# Patient Record
Sex: Female | Born: 1955 | Hispanic: Yes | Smoking: Never smoker
Health system: Southern US, Community
[De-identification: ages and names within clinical notes are randomized; demographics above are authoritative.]

## PROBLEM LIST (undated history)

## (undated) DIAGNOSIS — Z86010 Personal history of colonic polyps: Secondary | ICD-10-CM

## (undated) DIAGNOSIS — Z860101 Personal history of adenomatous and serrated colon polyps: Secondary | ICD-10-CM

## (undated) DIAGNOSIS — E042 Nontoxic multinodular goiter: Secondary | ICD-10-CM

## (undated) DIAGNOSIS — I499 Cardiac arrhythmia, unspecified: Secondary | ICD-10-CM

## (undated) DIAGNOSIS — I10 Essential (primary) hypertension: Secondary | ICD-10-CM

## (undated) HISTORY — PX: TUBAL LIGATION: SHX77

## (undated) HISTORY — PX: ABDOMINAL HYSTERECTOMY: SHX81

---

## 2005-08-06 ENCOUNTER — Emergency Department: Payer: Self-pay | Admitting: Internal Medicine

## 2005-08-06 ENCOUNTER — Other Ambulatory Visit: Payer: Self-pay

## 2006-05-08 ENCOUNTER — Emergency Department: Payer: Self-pay | Admitting: Emergency Medicine

## 2006-09-27 ENCOUNTER — Ambulatory Visit: Payer: Self-pay

## 2007-11-25 DIAGNOSIS — F418 Other specified anxiety disorders: Secondary | ICD-10-CM | POA: Insufficient documentation

## 2008-01-02 DIAGNOSIS — J309 Allergic rhinitis, unspecified: Secondary | ICD-10-CM | POA: Insufficient documentation

## 2008-04-07 ENCOUNTER — Encounter: Payer: Self-pay | Admitting: Family Medicine

## 2008-06-10 ENCOUNTER — Ambulatory Visit: Payer: Self-pay | Admitting: Pain Medicine

## 2008-07-22 ENCOUNTER — Ambulatory Visit: Payer: Self-pay | Admitting: Pain Medicine

## 2008-07-23 ENCOUNTER — Emergency Department: Payer: Self-pay | Admitting: Emergency Medicine

## 2008-08-05 ENCOUNTER — Ambulatory Visit: Payer: Self-pay | Admitting: Pain Medicine

## 2009-12-10 DIAGNOSIS — N951 Menopausal and female climacteric states: Secondary | ICD-10-CM | POA: Insufficient documentation

## 2010-04-22 ENCOUNTER — Emergency Department: Payer: Self-pay | Admitting: Emergency Medicine

## 2010-11-14 DIAGNOSIS — D126 Benign neoplasm of colon, unspecified: Secondary | ICD-10-CM | POA: Insufficient documentation

## 2012-01-24 DIAGNOSIS — Z0000 Encounter for general adult medical examination without abnormal findings: Secondary | ICD-10-CM | POA: Insufficient documentation

## 2012-09-11 ENCOUNTER — Emergency Department: Payer: Self-pay | Admitting: Emergency Medicine

## 2013-02-26 DIAGNOSIS — E049 Nontoxic goiter, unspecified: Secondary | ICD-10-CM | POA: Insufficient documentation

## 2013-09-24 DIAGNOSIS — M542 Cervicalgia: Secondary | ICD-10-CM | POA: Insufficient documentation

## 2013-10-08 DIAGNOSIS — M26629 Arthralgia of temporomandibular joint, unspecified side: Secondary | ICD-10-CM | POA: Insufficient documentation

## 2015-10-29 ENCOUNTER — Encounter: Payer: Self-pay | Admitting: Emergency Medicine

## 2015-10-29 ENCOUNTER — Emergency Department
Admission: EM | Admit: 2015-10-29 | Discharge: 2015-10-29 | Disposition: A | Discharge status: Home / Self Care | Payer: BLUE CROSS/BLUE SHIELD | Attending: Emergency Medicine | Admitting: Emergency Medicine

## 2015-10-29 ENCOUNTER — Emergency Department: Payer: BLUE CROSS/BLUE SHIELD

## 2015-10-29 DIAGNOSIS — R000 Tachycardia, unspecified: Secondary | ICD-10-CM | POA: Insufficient documentation

## 2015-10-29 DIAGNOSIS — R61 Generalized hyperhidrosis: Secondary | ICD-10-CM | POA: Diagnosis not present

## 2015-10-29 DIAGNOSIS — Z79899 Other long term (current) drug therapy: Secondary | ICD-10-CM | POA: Insufficient documentation

## 2015-10-29 DIAGNOSIS — I10 Essential (primary) hypertension: Secondary | ICD-10-CM | POA: Diagnosis not present

## 2015-10-29 DIAGNOSIS — J181 Lobar pneumonia, unspecified organism: Secondary | ICD-10-CM

## 2015-10-29 DIAGNOSIS — R5383 Other fatigue: Secondary | ICD-10-CM | POA: Diagnosis not present

## 2015-10-29 DIAGNOSIS — J189 Pneumonia, unspecified organism: Secondary | ICD-10-CM

## 2015-10-29 DIAGNOSIS — R079 Chest pain, unspecified: Secondary | ICD-10-CM | POA: Diagnosis present

## 2015-10-29 HISTORY — DX: Essential (primary) hypertension: I10

## 2015-10-29 LAB — COMPREHENSIVE METABOLIC PANEL
ALK PHOS: 84 U/L (ref 38–126)
ALT: 29 U/L (ref 14–54)
AST: 32 U/L (ref 15–41)
Albumin: 4.3 g/dL (ref 3.5–5.0)
Anion gap: 7 (ref 5–15)
BUN: 13 mg/dL (ref 6–20)
CO2: 23 mmol/L (ref 22–32)
CREATININE: 0.69 mg/dL (ref 0.44–1.00)
Calcium: 8.3 mg/dL — ABNORMAL LOW (ref 8.9–10.3)
Chloride: 103 mmol/L (ref 101–111)
GFR calc Af Amer: >60 mL/min (ref >60)
Glucose, Bld: 156 mg/dL — ABNORMAL HIGH (ref 65–99)
Potassium: 2.9 mmol/L — CL (ref 3.5–5.1)
Sodium: 133 mmol/L — ABNORMAL LOW (ref 135–145)
TOTAL PROTEIN: 7.7 g/dL (ref 6.5–8.1)
Total Bilirubin: 1 mg/dL (ref 0.3–1.2)

## 2015-10-29 LAB — URINALYSIS COMPLETE WITH MICROSCOPIC (ARMC ONLY)
BACTERIA UA: NONE SEEN
BILIRUBIN URINE: NEGATIVE
GLUCOSE, UA: NEGATIVE mg/dL
Ketones, ur: NEGATIVE mg/dL
Leukocytes, UA: NEGATIVE
NITRITE: NEGATIVE
Protein, ur: NEGATIVE mg/dL
SQUAMOUS EPITHELIAL / LPF: NONE SEEN
Specific Gravity, Urine: 1.001 — ABNORMAL LOW (ref 1.005–1.030)
WBC UA: NONE SEEN WBC/hpf (ref 0–5)
pH: 7 (ref 5.0–8.0)

## 2015-10-29 LAB — CBC WITH DIFFERENTIAL/PLATELET
Basophils Absolute: 0.1 10*3/uL (ref 0–0.1)
Basophils Relative: 1 %
EOS PCT: 1 %
Eosinophils Absolute: 0.1 10*3/uL (ref 0–0.7)
HEMATOCRIT: 38.5 % (ref 35.0–47.0)
Hemoglobin: 12.9 g/dL (ref 12.0–16.0)
LYMPHS ABS: 0.6 10*3/uL — AB (ref 1.0–3.6)
LYMPHS PCT: 6 %
MCH: 27.1 pg (ref 26.0–34.0)
MCHC: 33.6 g/dL (ref 32.0–36.0)
MCV: 80.8 fL (ref 80.0–100.0)
MONO ABS: 0.6 10*3/uL (ref 0.2–0.9)
MONOS PCT: 6 %
NEUTROS ABS: 8.6 10*3/uL — AB (ref 1.4–6.5)
Neutrophils Relative %: 86 %
PLATELETS: 256 10*3/uL (ref 150–440)
RBC: 4.77 MIL/uL (ref 3.80–5.20)
RDW: 14.6 % — AB (ref 11.5–14.5)
WBC: 9.9 10*3/uL (ref 3.6–11.0)

## 2015-10-29 LAB — RAPID INFLUENZA A&B ANTIGENS (ARMC ONLY): INFLUENZA B (ARMC): DETECTED

## 2015-10-29 LAB — PROTIME-INR
INR: 0.99
PROTHROMBIN TIME: 13.3 s (ref 11.4–15.0)

## 2015-10-29 LAB — RAPID INFLUENZA A&B ANTIGENS: Influenza A (ARMC): NOT DETECTED

## 2015-10-29 LAB — TROPONIN I

## 2015-10-29 LAB — LACTIC ACID, PLASMA: Lactic Acid, Venous: 1.1 mmol/L (ref 0.5–2.0)

## 2015-10-29 MED ORDER — IOHEXOL 350 MG/ML SOLN: 100.0000 mL | Freq: Once | INTRAVENOUS | Status: DC | PRN

## 2015-10-29 MED ORDER — HYDROCODONE-ACETAMINOPHEN 5-325 MG PO TABS
1.0000 | ORAL_TABLET | Freq: Once | ORAL | Status: AC
  Administered 2015-10-29: 1 via ORAL
  Filled 2015-10-29: qty 1

## 2015-10-29 MED ORDER — LEVOFLOXACIN 750 MG PO TABS
750.0000 mg | ORAL_TABLET | Freq: Once | ORAL | Status: AC
  Administered 2015-10-29: 750 mg via ORAL
  Filled 2015-10-29: qty 1

## 2015-10-29 MED ORDER — LEVOFLOXACIN 750 MG PO TABS: 750.0000 mg | ORAL_TABLET | Freq: Every day | ORAL | Status: DC

## 2015-10-29 MED ORDER — SODIUM CHLORIDE 0.9 % IV BOLUS (SEPSIS)
1000.0000 mL | INTRAVENOUS | Status: AC
Start: 2015-10-29 — End: 2015-10-29
  Administered 2015-10-29: 1000 mL via INTRAVENOUS

## 2015-10-29 MED ORDER — IBUPROFEN 600 MG PO TABS
600.0000 mg | ORAL_TABLET | ORAL | Status: AC
  Administered 2015-10-29: 600 mg via ORAL
  Filled 2015-10-29: qty 1

## 2015-10-29 MED ORDER — HYDROCODONE-ACETAMINOPHEN 5-325 MG PO TABS
1.0000 | ORAL_TABLET | ORAL | Status: AC
  Administered 2015-10-29: 1 via ORAL
  Filled 2015-10-29: qty 1

## 2015-10-29 MED ORDER — HYDROCODONE-ACETAMINOPHEN 5-325 MG PO TABS: 1.0000 | ORAL_TABLET | Freq: Four times a day (QID) | ORAL | Status: DC | PRN

## 2015-10-29 MED ORDER — POTASSIUM CHLORIDE CRYS ER 20 MEQ PO TBCR
40.0000 meq | EXTENDED_RELEASE_TABLET | Freq: Once | ORAL | Status: AC
  Administered 2015-10-29: 40 meq via ORAL
  Filled 2015-10-29: qty 2

## 2015-10-29 NOTE — ED Provider Notes (Signed)
[facility] Emergency Department Provider Note  ____________________________________________  Time seen: Approximately 2:35 PM  I have reviewed the triage vital signs and the nursing notes.   HISTORY  Chief Complaint Chest Pain; Shortness of Breath; and Cough    HPI [facility] Jamie Trevino is a 60 y.o. female presents for evaluation of pain behind the left breast with associated cough. She began having fevers chills and general aches. She feels like "her bones" are aching. She has had nausea and vomited once earlier today.  States she has a sharp and achy pain behind the left breast area. Denies headache or neck pain or stiffness. No abdominal pain. No rash. No recent illness. No history of immune problems. She does report not having had a flu shot this year.   Past Medical History  Diagnosis Date  . Hypertension     There are no active problems to display for this patient.   Past Surgical History  Procedure Laterality Date  . Abdominal hysterectomy      Current Outpatient Rx  Name  Route  Sig  Dispense  Refill  . metoprolol tartrate (LOPRESSOR) 25 MG tablet   Oral   Take 25 mg by mouth 2 (two) times daily.         Marland Kitchen HYDROcodone-acetaminophen (NORCO/VICODIN) 5-325 MG tablet   Oral   Take 1 tablet by mouth every 6 (six) hours as needed for moderate pain.   15 tablet   0   . levofloxacin (LEVAQUIN) 750 MG tablet   Oral   Take 1 tablet (750 mg total) by mouth daily.   5 tablet   0     Allergies Review of patient's allergies indicates no known allergies.  History reviewed. No pertinent family history.  Social History Social History  Substance Use Topics  . Smoking status: Never Smoker   . Smokeless tobacco: None  . Alcohol Use: No    Review of Systems Constitutional: Fatigue Eyes: No visual changes. ENT: No sore throat. Cardiovascular: See history of present illness Respiratory: Mild shortness of breath Gastrointestinal: No  abdominal pain.  No diarrhea.  No constipation. Genitourinary: Negative for dysuria. Musculoskeletal: Negative for back pain. Skin: Negative for rash. Neurological: Negative for headaches, focal weakness or numbness.  10-point ROS otherwise negative.  ____________________________________________   PHYSICAL EXAM:  VITAL SIGNS: ED Triage Vitals  Enc Vitals Group     BP 10/29/15 1307 148/98 mmHg     Pulse Rate 10/29/15 1307 123     Resp 10/29/15 1307 23     Temp 10/29/15 1307 100.5 F (38.1 C)     Temp Source 10/29/15 1307 Oral     SpO2 10/29/15 1307 97 %     Weight 10/29/15 1307 127 lb (57.607 kg)     Height 10/29/15 1307 4\' 11"  (1.499 m)     Head Cir --      Peak Flow --      Pain Score 10/29/15 1349 10     Pain Loc --      Pain Edu? --      Excl. in [place]? --   History physical exam performed using Spanish interpreter Constitutional: Alert and oriented. Slightly fatigued, slightly diaphoretic but in no distress. Very pleasant Eyes: Conjunctivae are normal. PERRL. EOMI. Head: Atraumatic. Nose: No congestion/rhinnorhea. Mouth/Throat: Mucous membranes are moist.  Oropharynx non-erythematous. Neck: No stridor.  No meningismus Cardiovascular: Tachycardic rate, regular rhythm. Grossly normal heart sounds.  Good peripheral circulation. Respiratory: Tachypnea, but otherwise Normal.  No retractions. Lungs CTAB. Gastrointestinal: Soft and nontender. No distention. No abdominal bruits. No CVA tenderness. Musculoskeletal: No lower extremity tenderness nor edema.  No joint effusions. Neurologic:  Normal speech and language. No gross focal neurologic deficits are appreciated. No gait instability. Skin:  Skin is warm, diaphoretic and intact. No rash noted. Psychiatric: Mood and affect are normal. Speech and behavior are normal.  ____________________________________________   LABS (all labs ordered are listed, but only abnormal results are displayed)  Labs Reviewed  COMPREHENSIVE  METABOLIC PANEL - Abnormal; Notable for the following:    Sodium 133 (*)    Potassium 2.9 (*)    Glucose, Bld 156 (*)    Calcium 8.3 (*)    All other components within normal limits  CBC WITH DIFFERENTIAL/PLATELET - Abnormal; Notable for the following:    RDW 14.6 (*)    Neutro Abs 8.6 (*)    Lymphs Abs 0.6 (*)    All other components within normal limits  URINALYSIS COMPLETEWITH MICROSCOPIC ([facility] ONLY) - Abnormal; Notable for the following:    Color, Urine COLORLESS (*)    APPearance CLEAR (*)    Specific Gravity, Urine 1.001 (*)    Hgb urine dipstick 1+ (*)    All other components within normal limits  RAPID INFLUENZA A&B ANTIGENS ([facility] ONLY)  CULTURE, BLOOD (ROUTINE X 2)  CULTURE, BLOOD (ROUTINE X 2)  URINE CULTURE  LACTIC ACID, PLASMA  TROPONIN I  PROTIME-INR  INFLUENZA PANEL BY PCR (TYPE A & B, H1N1)   ____________________________________________  EKG  Reviewed her by me at 1310 Heart rate 120 QRS 96 QTc 480 Reviewed and interpreted as sinus tachycardia, no acute ischemic abnormality ____________________________________________  RADIOLOGY  DG Chest 2 View (Final result) Result time: 10/29/15 13:54:29   Final result by Rad Results In Interface (10/29/15 13:54:29)   Narrative:   CLINICAL DATA: Chest tightness. Right-sided rib pain.  EXAM: CHEST 2 VIEW  COMPARISON: 04/22/2010; 07/23/2008  FINDINGS: Grossly unchanged cardiac silhouette and mediastinal contours. Evaluation the retrosternal clear space obscured secondary overlying soft tissues. There is mild diffuse slightly nodular thickening of the pulmonary interstitium. Minimal left basilar linear heterogeneous opacities favored to represent atelectasis. No discrete focal airspace opacities. No pleural effusion or pneumothorax. No evidence of edema. No acute osseus abnormalities.  IMPRESSION: Findings suggestive of airways disease / bronchitis. No focal airspace opacities to suggest  pneumonia.   Electronically Signed By: Sandi Mariscal M.D. On: 10/29/2015 13:54      CT Angio Chest PE W/Cm &/Or Wo Cm (Final result) Result time: 10/29/15 18:08:01   Final result by Rad Results In Interface (10/29/15 18:08:01)   Narrative:   CLINICAL DATA: Chest tightness, nausea, vomiting, cough.  EXAM: CT ANGIOGRAPHY CHEST WITH CONTRAST  TECHNIQUE: Multidetector CT imaging of the chest was performed using the standard protocol during bolus administration of intravenous contrast. Multiplanar CT image reconstructions and MIPs were obtained to evaluate the vascular anatomy.  CONTRAST: 100 cc Omnipaque 350 IV  COMPARISON: None.  FINDINGS: No filling defects in the pulmonary arteries to suggest pulmonary emboli. Heart is upper limits normal in size. Aorta is normal caliber. No mediastinal, hilar, or axillary adenopathy. Chest wall soft tissues are unremarkable.  Ground-glass densities noted within the left upper lobe peripherally. This could reflect early area of consolidation or small airways disease/ bronchiolitis. No suspicious pulmonary nodules. No pleural effusions.  Imaging into the upper abdomen shows no acute findings. No acute bony abnormality or focal bone lesion.  Review of the MIP  images confirms the above findings.  IMPRESSION: Patchy ground-glass opacities noted in the left upper lobe posteriorly and laterally. Cannot exclude early infiltrate/pneumonia or small airways disease/ bronchiolitis.  No evidence of pulmonary embolus.   Electronically Signed By: Rolm Baptise M.D. On: 10/29/2015 18:08    ____________________________________________   PROCEDURES  Procedure(s) performed: None  Critical Care performed: No  ____________________________________________   INITIAL IMPRESSION / ASSESSMENT AND PLAN / ED COURSE  Pertinent labs & imaging results that were available during my care of the patient were reviewed by me and  considered in my medical decision making (see chart for details).  Patient presents for cough, left lower back pain noted to have low-grade fever. Chest x-ray clear, and no evidence of wheezing or reactive airway disease to auscultation.  EKG and troponin reassuring, patient had CT angiography to evaluate for pulmonary embolism in the setting of left sided low mid back pain. CT concerning for possible pneumonia. After receiving fluids, patient's blood pressure is stable, heart rate is normalized, and her oxygen saturation is normal on room air. She reports improvement is currently resting comfortably. As she appears to be most appropriate for outpatient management. I will initiate Levaquin by mouth, provide pain control.   ----------------------------------------- 8:06 PM on 10/29/2015 -----------------------------------------  Patient reports feeling much improved. She is currently awake and alert with normal hemodynamics. Plan to discharge her with Levaquin, prescription for hydrocodone for pain. She reports feeling much better. Lungs are clear, speaking in full clear sentences with no signs of respiratory distress. Respiratory rate currently 18, oxygen saturation 99%.  Patient appropriate for outpatient treatment. Careful return precautions discussed with patient and family.  Return precautions and treatment recommendations and follow-up discussed with the patient who is agreeable with the plan.  I will prescribe the patient a narcotic pain medicine due to their condition which I anticipate will cause at least moderate pain short term. I discussed with the patient safe use of narcotic pain medicines, and that they are not to drive, work in dangerous areas, or ever take more than prescribed (no more than 1 pill every 6 hours). We discussed that this is the type of medication that can be  overdosed on and the risks of this type of medicine. Patient is very agreeable to only use as prescribed and to  never use more than prescribed.  ____________________________________________   FINAL CLINICAL IMPRESSION(S) / ED DIAGNOSES  Final diagnoses:  Chest pain  Left upper lobe pneumonia      Delman Kitten, MD 10/29/15 2009

## 2015-10-29 NOTE — ED Notes (Signed)
Urine is clear

## 2015-10-29 NOTE — ED Notes (Signed)
Report given to Amy RN.

## 2015-10-29 NOTE — ED Notes (Signed)
Pt presents with left breast pain and cough for three days. Dry cough  Noted, back pain when coughing.

## 2015-10-29 NOTE — ED Notes (Signed)
Patient from home with c/o backpain (right sided). C/o chest tightness with nausea and vomiting with cough. Patient appears in distress. Pain onset at 0900

## 2015-10-29 NOTE — ED Notes (Signed)
Patient transported to CT 

## 2015-10-29 NOTE — ED Notes (Signed)
Resumed care from [facility].  Pt states she aches all over.  nsr on monitor.  Iv in place.  Family with pt.

## 2015-10-31 LAB — URINE CULTURE

## 2015-11-03 LAB — CULTURE, BLOOD (ROUTINE X 2)
CULTURE: NO GROWTH
Culture: NO GROWTH

## 2015-12-09 DIAGNOSIS — R7309 Other abnormal glucose: Secondary | ICD-10-CM | POA: Insufficient documentation

## 2015-12-24 DIAGNOSIS — I10 Essential (primary) hypertension: Secondary | ICD-10-CM | POA: Insufficient documentation

## 2016-01-10 DIAGNOSIS — R079 Chest pain, unspecified: Secondary | ICD-10-CM | POA: Insufficient documentation

## 2016-06-19 ENCOUNTER — Other Ambulatory Visit: Payer: Self-pay | Admitting: Physician Assistant

## 2016-06-19 DIAGNOSIS — Z1231 Encounter for screening mammogram for malignant neoplasm of breast: Secondary | ICD-10-CM

## 2016-07-26 ENCOUNTER — Ambulatory Visit: Payer: BLUE CROSS/BLUE SHIELD

## 2016-08-07 ENCOUNTER — Ambulatory Visit
Admission: RE | Admit: 2016-08-07 | Discharge: 2016-08-07 | Disposition: A | Discharge status: Home / Self Care | Payer: BLUE CROSS/BLUE SHIELD | Source: Ambulatory Visit | Attending: Physician Assistant | Admitting: Physician Assistant

## 2016-08-07 DIAGNOSIS — Z1231 Encounter for screening mammogram for malignant neoplasm of breast: Secondary | ICD-10-CM | POA: Insufficient documentation

## 2016-09-14 DIAGNOSIS — Z86010 Personal history of colonic polyps: Secondary | ICD-10-CM | POA: Insufficient documentation

## 2016-09-14 HISTORY — PX: OTHER SURGICAL HISTORY: SHX169

## 2016-09-15 DIAGNOSIS — K219 Gastro-esophageal reflux disease without esophagitis: Secondary | ICD-10-CM | POA: Insufficient documentation

## 2016-10-24 DIAGNOSIS — M67442 Ganglion, left hand: Secondary | ICD-10-CM | POA: Insufficient documentation

## 2016-11-17 DIAGNOSIS — E042 Nontoxic multinodular goiter: Secondary | ICD-10-CM | POA: Insufficient documentation

## 2016-12-21 ENCOUNTER — Encounter: Payer: Self-pay | Admitting: *Deleted

## 2016-12-22 ENCOUNTER — Ambulatory Visit: Payer: BLUE CROSS/BLUE SHIELD | Admitting: Certified Registered Nurse Anesthetist

## 2016-12-22 ENCOUNTER — Ambulatory Visit
Admission: RE | Admit: 2016-12-22 | Discharge: 2016-12-22 | Disposition: A | Discharge status: Home / Self Care | Payer: BLUE CROSS/BLUE SHIELD | Source: Ambulatory Visit | Attending: Unknown Physician Specialty | Admitting: Unknown Physician Specialty

## 2016-12-22 ENCOUNTER — Encounter
Admission: RE | Disposition: A | Discharge status: Home / Self Care | Payer: Self-pay | Source: Ambulatory Visit | Attending: Unknown Physician Specialty

## 2016-12-22 ENCOUNTER — Encounter: Payer: Self-pay | Admitting: *Deleted

## 2016-12-22 DIAGNOSIS — I10 Essential (primary) hypertension: Secondary | ICD-10-CM | POA: Insufficient documentation

## 2016-12-22 DIAGNOSIS — Z86010 Personal history of colonic polyps: Secondary | ICD-10-CM | POA: Diagnosis not present

## 2016-12-22 DIAGNOSIS — Z79899 Other long term (current) drug therapy: Secondary | ICD-10-CM | POA: Diagnosis not present

## 2016-12-22 DIAGNOSIS — K5730 Diverticulosis of large intestine without perforation or abscess without bleeding: Secondary | ICD-10-CM | POA: Diagnosis not present

## 2016-12-22 DIAGNOSIS — Z1211 Encounter for screening for malignant neoplasm of colon: Secondary | ICD-10-CM | POA: Diagnosis not present

## 2016-12-22 DIAGNOSIS — K640 First degree hemorrhoids: Secondary | ICD-10-CM | POA: Insufficient documentation

## 2016-12-22 HISTORY — PX: COLONOSCOPY: SHX5424

## 2016-12-22 SURGERY — COLONOSCOPY
Anesthesia: General

## 2016-12-22 MED ORDER — LIDOCAINE HCL (CARDIAC) 20 MG/ML IV SOLN
INTRAVENOUS | Status: DC | PRN
  Administered 2016-12-22: 50 mg via INTRAVENOUS

## 2016-12-22 MED ORDER — PROPOFOL 500 MG/50ML IV EMUL
INTRAVENOUS | Status: DC | PRN
  Administered 2016-12-22: 140 ug/kg/min via INTRAVENOUS

## 2016-12-22 MED ORDER — PROPOFOL 10 MG/ML IV BOLUS
INTRAVENOUS | Status: DC | PRN
  Administered 2016-12-22: 30 mg via INTRAVENOUS

## 2016-12-22 MED ORDER — ONDANSETRON HCL 4 MG/2ML IJ SOLN: 4.0000 mg | Freq: Once | INTRAMUSCULAR | Status: DC | PRN

## 2016-12-22 MED ORDER — LIDOCAINE HCL (PF) 2 % IJ SOLN
INTRAMUSCULAR | Status: AC
  Filled 2016-12-22: qty 2

## 2016-12-22 MED ORDER — FENTANYL CITRATE (PF) 100 MCG/2ML IJ SOLN: 25.0000 ug | INTRAMUSCULAR | Status: DC | PRN

## 2016-12-22 MED ORDER — SODIUM CHLORIDE 0.9 % IV SOLN: INTRAVENOUS | Status: DC

## 2016-12-22 MED ORDER — SODIUM CHLORIDE 0.9 % IV SOLN
INTRAVENOUS | Status: DC
  Administered 2016-12-22: 09:00:00 via INTRAVENOUS

## 2016-12-22 MED ORDER — MIDAZOLAM HCL 2 MG/2ML IJ SOLN
INTRAMUSCULAR | Status: AC
  Filled 2016-12-22: qty 2

## 2016-12-22 MED ORDER — MIDAZOLAM HCL 2 MG/2ML IJ SOLN
INTRAMUSCULAR | Status: DC | PRN
  Administered 2016-12-22: 2 mg via INTRAVENOUS

## 2016-12-22 MED ORDER — PROPOFOL 500 MG/50ML IV EMUL
INTRAVENOUS | Status: AC
  Filled 2016-12-22: qty 50

## 2016-12-22 NOTE — Anesthesia Procedure Notes (Signed)
Date/Time: 12/22/2016 9:15 AM Performed by: Johnna Acosta Pre-anesthesia Checklist: Patient identified, Emergency Drugs available, Suction available, Patient being monitored and Timeout performed Patient Re-evaluated:Patient Re-evaluated prior to inductionOxygen Delivery Method: Nasal cannula

## 2016-12-22 NOTE — Anesthesia Postprocedure Evaluation (Signed)
Anesthesia Post Note  Patient: [name]  Procedure(s) Performed: Procedure(s) (LRB): COLONOSCOPY (N/A)  Patient location during evaluation: PACU Anesthesia Type: General Level of consciousness: awake and alert and oriented Pain management: pain level controlled Vital Signs Assessment: post-procedure vital signs reviewed and stable Respiratory status: spontaneous breathing Cardiovascular status: blood pressure returned to baseline Anesthetic complications: no     Last Vitals:  Vitals:   12/22/16 0940 12/22/16 0950  BP: 113/65 129/69  Pulse: 62 64  Resp: 14 18  Temp:      Last Pain:  Vitals:   12/22/16 0930  TempSrc: Tympanic                 Jaivon Vanbeek

## 2016-12-22 NOTE — Transfer of Care (Signed)
Immediate Anesthesia Transfer of Care Note  Patient: Jamie Trevino  Procedure(s) Performed: Procedure(s): COLONOSCOPY (N/A)  Patient Location: PACU  Anesthesia Type:General  Level of Consciousness: sedated  Airway & Oxygen Therapy: Patient Spontanous Breathing and Patient connected to nasal cannula oxygen  Post-op Assessment: Report given to RN and Post -op Vital signs reviewed and stable  Post vital signs: Reviewed and stable  Last Vitals:  Vitals:   12/22/16 0848 12/22/16 0930  BP: 136/83 106/60  Pulse: 65 63  Resp: 18 14  Temp: (!) 36.1 C 36.6 C    Last Pain:  Vitals:   12/22/16 0930  TempSrc: Tympanic         Complications: No apparent anesthesia complications

## 2016-12-22 NOTE — Anesthesia Preprocedure Evaluation (Addendum)
Anesthesia Evaluation  Patient identified by MRN, date of birth, ID band Patient awake    Reviewed: Allergy & Precautions, NPO status , Patient's Chart, lab work & pertinent test results  Airway Mallampati: III  TM Distance: <3 FB     Dental  (+) Chipped   Pulmonary neg pulmonary ROS,    Pulmonary exam normal        Cardiovascular hypertension, Normal cardiovascular exam     Neuro/Psych negative neurological ROS  negative psych ROS   GI/Hepatic negative GI ROS, Neg liver ROS,   Endo/Other  negative endocrine ROS  Renal/GU negative Renal ROS     Musculoskeletal negative musculoskeletal ROS (+)   Abdominal Normal abdominal exam  (+)   Peds  Hematology negative hematology ROS (+)   Anesthesia Other Findings   Reproductive/Obstetrics                            Anesthesia Physical Anesthesia Plan  ASA: II  Anesthesia Plan: General   Post-op Pain Management:    Induction: Intravenous  Airway Management Planned:   Additional Equipment:   Intra-op Plan:   Post-operative Plan:   Informed Consent: I have reviewed the patients History and Physical, chart, labs and discussed the procedure including the risks, benefits and alternatives for the proposed anesthesia with the patient or authorized representative who has indicated his/her understanding and acceptance.   Dental advisory given  Plan Discussed with: CRNA and Surgeon  Anesthesia Plan Comments:         Anesthesia Quick Evaluation

## 2016-12-22 NOTE — Op Note (Signed)
[facility] Gastroenterology Patient Name: Jamie Trevino Procedure Date: 12/22/2016 9:13 AM MRN: [id number] Account #: [id number] Date of Birth: Mar 01, 1956 Admit Type: Outpatient Age: 61 Room: [facility] ENDO ROOM 1 Gender: Female Note Status: Finalized Procedure:            Colonoscopy Indications:          High risk colon cancer surveillance: Personal history                        of colonic polyps Providers:            Manya Silvas, MD Referring MD:         No Local Md, MD (Referring MD) Medicines:            Propofol per Anesthesia Complications:        No immediate complications. Procedure:            Pre-Anesthesia Assessment:                       - After reviewing the risks and benefits, the patient                        was deemed in satisfactory condition to undergo the                        procedure.                       After obtaining informed consent, the colonoscope was                        passed under direct vision. Throughout the procedure,                        the patient's blood pressure, pulse, and oxygen                        saturations were monitored continuously. The                        Colonoscope was introduced through the anus and                        advanced to the the cecum, identified by appendiceal                        orifice and ileocecal valve. The colonoscopy was                        performed without difficulty. The patient tolerated the                        procedure well. The quality of the bowel preparation                        was excellent. Findings:      A few small-mouthed diverticula were found in the sigmoid colon.      Internal hemorrhoids were found during endoscopy. The hemorrhoids were       small and Grade I (internal hemorrhoids that do not prolapse).      The exam  was otherwise without abnormality. Impression:           - Diverticulosis in the sigmoid colon.                       -  Internal hemorrhoids.                       - The examination was otherwise normal.                       - No specimens collected. Recommendation:       - Repeat colonoscopy in 5 years for surveillance. Manya Silvas, MD 12/22/2016 9:33:07 AM This report has been signed electronically. Number of Addenda: 0 Note Initiated On: 12/22/2016 9:13 AM Scope Withdrawal Time: 0 hours 6 minutes 52 seconds  Total Procedure Duration: 0 hours 13 minutes 0 seconds       [facility]

## 2016-12-22 NOTE — H&P (Signed)
   Primary Care Physician:  [place] Primary Gastroenterologist:  Dr. Vira Agar  Pre-Procedure History & Physical: HPI:  Jamie Trevino is a 61 y.o. female is here for an colonoscopy.   Past Medical History:  Diagnosis Date  . Hypertension     Past Surgical History:  Procedure Laterality Date  . ABDOMINAL HYSTERECTOMY    . adenomatous colon polyps  09/14/2016  . TUBAL LIGATION      Prior to Admission medications   Medication Sig Start Date End Date Taking? Authorizing Provider  amLODipine (NORVASC) 10 MG tablet Take 10 mg by mouth daily.   Yes Historical Provider, MD  losartan (COZAAR) 100 MG tablet Take 100 mg by mouth daily.   Yes Historical Provider, MD  HYDROcodone-acetaminophen (NORCO/VICODIN) 5-325 MG tablet Take 1 tablet by mouth every 6 (six) hours as needed for moderate pain. Patient not taking: Reported on 12/22/2016 10/29/15   Delman Kitten, MD  levofloxacin (LEVAQUIN) 750 MG tablet Take 1 tablet (750 mg total) by mouth daily. Patient not taking: Reported on 12/22/2016 10/29/15   Delman Kitten, MD  metoprolol tartrate (LOPRESSOR) 25 MG tablet Take 25 mg by mouth 2 (two) times daily.    Historical Provider, MD    Allergies as of 10/24/2016  . (No Known Allergies)    Family History  Problem Relation Age of Onset  . Prostate cancer Father     Social History   Social History  . Marital status: Widowed    Spouse name: N/A  . Number of children: N/A  . Years of education: N/A   Occupational History  . Not on file.   Social History Main Topics  . Smoking status: Never Smoker  . Smokeless tobacco: Never Used  . Alcohol use No  . Drug use: No  . Sexual activity: Not on file   Other Topics Concern  . Not on file   Social History Narrative  . No narrative on file    Review of Systems: See HPI, otherwise negative ROS  Physical Exam: BP 136/83   Pulse 65   Temp (!) 96.9 F (36.1 C) (Tympanic)   Resp 18   Ht 4' 11.84" (1.52 m)   Wt 57.6 kg  (127 lb)   SpO2 100%   BMI 24.93 kg/m  General:   Alert,  pleasant and cooperative in NAD Head:  Normocephalic and atraumatic. Neck:  Supple; no masses or thyromegaly. Lungs:  Clear throughout to auscultation.    Heart:  Regular rate and rhythm. Abdomen:  Soft, nontender and nondistended. Normal bowel sounds, without guarding, and without rebound.   Neurologic:  Alert and  oriented x4;  grossly normal neurologically.  Impression/Plan: Jamie Trevino is here for an colonoscopy to be performed for [facility] colon polyps.  Risks, benefits, limitations, and alternatives regarding  colonoscopy have been reviewed with the patient.  Questions have been answered.  All parties agreeable.   Gaylyn Cheers, MD  12/22/2016, 9:08 AM

## 2016-12-22 NOTE — Anesthesia Post-op Follow-up Note (Cosign Needed)
Anesthesia QCDR form completed.        

## 2016-12-25 ENCOUNTER — Encounter: Payer: Self-pay | Admitting: Unknown Physician Specialty

## 2017-07-05 ENCOUNTER — Other Ambulatory Visit: Payer: Self-pay | Admitting: Family Medicine

## 2017-07-05 DIAGNOSIS — Z1231 Encounter for screening mammogram for malignant neoplasm of breast: Secondary | ICD-10-CM

## 2017-07-17 ENCOUNTER — Ambulatory Visit
Admission: RE | Admit: 2017-07-17 | Discharge: 2017-07-17 | Disposition: A | Discharge status: Home / Self Care | Payer: BLUE CROSS/BLUE SHIELD | Source: Ambulatory Visit | Attending: Primary Care | Admitting: Primary Care

## 2017-07-17 ENCOUNTER — Other Ambulatory Visit: Payer: Self-pay | Admitting: Primary Care

## 2017-07-17 DIAGNOSIS — R05 Cough: Secondary | ICD-10-CM

## 2017-07-17 DIAGNOSIS — R059 Cough, unspecified: Secondary | ICD-10-CM

## 2017-08-03 ENCOUNTER — Ambulatory Visit
Admission: RE | Admit: 2017-08-03 | Discharge: 2017-08-03 | Disposition: A | Discharge status: Home / Self Care | Payer: BLUE CROSS/BLUE SHIELD | Source: Ambulatory Visit | Attending: Family Medicine | Admitting: Family Medicine

## 2017-08-03 DIAGNOSIS — Z1231 Encounter for screening mammogram for malignant neoplasm of breast: Secondary | ICD-10-CM

## 2017-08-24 ENCOUNTER — Ambulatory Visit: Payer: BLUE CROSS/BLUE SHIELD | Admitting: Internal Medicine

## 2017-08-24 DIAGNOSIS — M222X2 Patellofemoral disorders, left knee: Secondary | ICD-10-CM | POA: Insufficient documentation

## 2017-08-24 DIAGNOSIS — M222X1 Patellofemoral disorders, right knee: Secondary | ICD-10-CM | POA: Insufficient documentation

## 2017-09-28 ENCOUNTER — Ambulatory Visit (INDEPENDENT_AMBULATORY_CARE_PROVIDER_SITE_OTHER): Payer: BLUE CROSS/BLUE SHIELD | Admitting: Internal Medicine

## 2017-09-28 ENCOUNTER — Encounter: Payer: Self-pay | Admitting: Internal Medicine

## 2017-09-28 VITALS — BP 138/92 | HR 77 | Temp 97.8°F | Resp 16 | Ht 58.5 in | Wt 131.0 lb

## 2017-09-28 DIAGNOSIS — R05 Cough: Secondary | ICD-10-CM

## 2017-09-28 DIAGNOSIS — R059 Cough, unspecified: Secondary | ICD-10-CM

## 2017-09-28 NOTE — Progress Notes (Signed)
Jamie Trevino pt supposed to be referred to pulmonology referred here for chronic cough sent Juanda Crumble drew fax asking them to correctly schedule with [facility] pulm asap and contact pt   No charge today   [place]

## 2017-10-02 ENCOUNTER — Emergency Department: Payer: BLUE CROSS/BLUE SHIELD

## 2017-10-02 ENCOUNTER — Emergency Department
Admission: EM | Admit: 2017-10-02 | Discharge: 2017-10-02 | Disposition: A | Discharge status: Home / Self Care | Payer: BLUE CROSS/BLUE SHIELD | Attending: Emergency Medicine | Admitting: Emergency Medicine

## 2017-10-02 ENCOUNTER — Other Ambulatory Visit: Payer: Self-pay

## 2017-10-02 ENCOUNTER — Encounter: Payer: Self-pay | Admitting: Emergency Medicine

## 2017-10-02 DIAGNOSIS — E876 Hypokalemia: Secondary | ICD-10-CM | POA: Insufficient documentation

## 2017-10-02 DIAGNOSIS — R0602 Shortness of breath: Secondary | ICD-10-CM | POA: Insufficient documentation

## 2017-10-02 DIAGNOSIS — I10 Essential (primary) hypertension: Secondary | ICD-10-CM | POA: Insufficient documentation

## 2017-10-02 LAB — URINALYSIS, COMPLETE (UACMP) WITH MICROSCOPIC
Bacteria, UA: NONE SEEN
Bilirubin Urine: NEGATIVE
Glucose, UA: NEGATIVE mg/dL
KETONES UR: NEGATIVE mg/dL
LEUKOCYTES UA: NEGATIVE
Nitrite: NEGATIVE
PROTEIN: NEGATIVE mg/dL
SQUAMOUS EPITHELIAL / LPF: NONE SEEN
Specific Gravity, Urine: 1.002 — ABNORMAL LOW (ref 1.005–1.030)
WBC UA: NONE SEEN WBC/hpf (ref 0–5)
pH: 7 (ref 5.0–8.0)

## 2017-10-02 LAB — BASIC METABOLIC PANEL
Anion gap: 7 (ref 5–15)
BUN: 24 mg/dL — ABNORMAL HIGH (ref 6–20)
CO2: 26 mmol/L (ref 22–32)
Calcium: 9.5 mg/dL (ref 8.9–10.3)
Chloride: 107 mmol/L (ref 101–111)
Creatinine, Ser: 0.73 mg/dL (ref 0.44–1.00)
GFR calc Af Amer: >60 mL/min (ref >60)
GFR calc non Af Amer: >60 mL/min (ref >60)
GLUCOSE: 114 mg/dL — AB (ref 65–99)
POTASSIUM: 3.2 mmol/L — AB (ref 3.5–5.1)
Sodium: 140 mmol/L (ref 135–145)

## 2017-10-02 LAB — CBC
HEMATOCRIT: 40.5 % (ref 35.0–47.0)
Hemoglobin: 13.4 g/dL (ref 12.0–16.0)
MCH: 27.2 pg (ref 26.0–34.0)
MCHC: 33.1 g/dL (ref 32.0–36.0)
MCV: 82.3 fL (ref 80.0–100.0)
Platelets: 387 10*3/uL (ref 150–440)
RBC: 4.93 MIL/uL (ref 3.80–5.20)
RDW: 14.6 % — ABNORMAL HIGH (ref 11.5–14.5)
WBC: 10.8 10*3/uL (ref 3.6–11.0)

## 2017-10-02 LAB — TROPONIN I: Troponin I: <0.03 ng/mL (ref <0.03)

## 2017-10-02 MED ORDER — POTASSIUM CHLORIDE CRYS ER 20 MEQ PO TBCR
40.0000 meq | EXTENDED_RELEASE_TABLET | Freq: Once | ORAL | Status: AC
  Administered 2017-10-02: 40 meq via ORAL
  Filled 2017-10-02: qty 2

## 2017-10-02 NOTE — ED Notes (Signed)
Topez not working 

## 2017-10-02 NOTE — ED Notes (Signed)
Pt c/o SOB earlier today. Pt denies any SOB at this time. Pt reports knot feeling in her throat. Pt denies any pain at this time.

## 2017-10-02 NOTE — ED Provider Notes (Signed)
[facility] Emergency Department Provider Note  ____________________________________________  Time seen: Approximately 11:08 PM  I have reviewed the triage vital signs and the nursing notes.   HISTORY  Chief Complaint Shortness of Breath    HPI Jamie Trevino is a 62 y.o. female with a history of hypertension presenting with shortness of breath.  The patient states that at 7 PM she was sitting in her home, not eating or drinking, when she had the acute sensation of shortness of breath and a lump in her throat.  This is lasted approximately 30 minutes.  She felt that she could not get any air and was breathing quickly, then developed bilateral hand tingling and perioral tingling.  This episode subsided on its own.  She did not have any associated chest pain, palpitations, lightheadedness or syncope.  She has had a cough for 2 months, which she treats with an albuterol MDI.  She attempted albuterol tonight, which did not help.  She did not feel anxious prior to or during this episode.  She has never experience anything like this before.  The patient has no personal or family history of blood clots  Past Medical History:  Diagnosis Date  . Hypertension     There are no active problems to display for this patient.   Past Surgical History:  Procedure Laterality Date  . ABDOMINAL HYSTERECTOMY    . adenomatous colon polyps  09/14/2016  . COLONOSCOPY N/A 12/22/2016   Procedure: COLONOSCOPY;  Surgeon: Manya Silvas, MD;  Location: [facility] ENDOSCOPY;  Service: Endoscopy;  Laterality: N/A;  . TUBAL LIGATION      Current Outpatient Rx  . Order #: [id number] Class: Historical Med  . Order #: [id number] Class: Historical Med  . Order #: [id number] Class: Print  . Order #: [id number] Class: Print    Allergies Patient has no known allergies.  Family History  Problem Relation Age of Onset  . Prostate cancer Father     Social History Social History   Tobacco Use   . Smoking status: Never Smoker  . Smokeless tobacco: Never Used  Substance Use Topics  . Alcohol use: No  . Drug use: No    Review of Systems Constitutional: No fever/chills.  No lightheadedness or syncope. Eyes: No visual changes. ENT: No sore throat. No congestion or rhinorrhea.  Positive lump in her throat. Cardiovascular: Denies chest pain. Denies palpitations. Respiratory: Positive shortness of breath.  Positive chronic unchanged cough. Gastrointestinal: No abdominal pain.  No nausea, no vomiting.  No diarrhea.  No constipation. Genitourinary: Negative for dysuria. Musculoskeletal: Negative for back pain.  No lower extremity swelling or calf pain. Skin: Negative for rash. Neurological: Negative for headaches. No focal numbness, or weakness.  Bilateral hand and perioral tingling in the setting of hyperventilation. Psych: Denies anxiety.    ____________________________________________   PHYSICAL EXAM:  VITAL SIGNS: ED Triage Vitals  Enc Vitals Group     BP 10/02/17 2124 (!) 134/116     Pulse Rate 10/02/17 2124 91     Resp 10/02/17 2124 (!) 36     Temp 10/02/17 2124 98.2 F (36.8 C)     Temp Source 10/02/17 2124 Oral     SpO2 10/02/17 2124 100 %     Weight 10/02/17 2120 131 lb (59.4 kg)     Height 10/02/17 2120 4' 10.5" (1.486 m)     Head Circumference --      Peak Flow --      Pain Score --  Pain Loc --      Pain Edu? --      Excl. in [place]? --     Constitutional: Alert and oriented. Well appearing and in no acute distress. Answers questions appropriately. Eyes: Conjunctivae are normal.  EOMI. No scleral icterus.  No eye discharge Head: Atraumatic. Nose: No congestion/rhinnorhea. Mouth/Throat: Mucous membranes are moist.  Neck: No stridor.  Supple.   Cardiovascular: Normal rate, regular rhythm. No murmurs, rubs or gallops.  Respiratory: Normal respiratory effort.  No accessory muscle use or retractions. Lungs CTAB.  No wheezes, rales or  ronchi. Gastrointestinal: Soft, nontender and nondistended.  No guarding or rebound.  No peritoneal signs. Musculoskeletal: No LE edema. No ttp in the calves or palpable cords.  Negative Homan's sign. Neurologic:  A&Ox3.  Speech is clear.  Face and smile are symmetric.  EOMI.  Moves all extremities well. Skin:  Skin is warm, dry and intact. No rash noted. Psychiatric: Mood and affect are normal. Speech and behavior are normal.  Normal judgement  ____________________________________________   LABS (all labs ordered are listed, but only abnormal results are displayed)  Labs Reviewed  BASIC METABOLIC PANEL - Abnormal; Notable for the following components:      Result Value   Potassium 3.2 (*)    Glucose, Bld 114 (*)    BUN 24 (*)    All other components within normal limits  CBC - Abnormal; Notable for the following components:   RDW 14.6 (*)    All other components within normal limits  URINALYSIS, COMPLETE (UACMP) WITH MICROSCOPIC - Abnormal; Notable for the following components:   Color, Urine COLORLESS (*)    APPearance CLEAR (*)    Specific Gravity, Urine 1.002 (*)    Hgb urine dipstick SMALL (*)    All other components within normal limits  TROPONIN I   ____________________________________________  EKG  ED ECG REPORT I, Eula Listen, the attending physician, personally viewed and interpreted this ECG.   Date: 10/02/2017  EKG Time: 2117  Rate: 95  Rhythm: normal sinus rhythm  Axis: normal  Intervals:none  ST&T Change: No STEMI  ____________________________________________  RADIOLOGY  Dg Chest 2 View  Result Date: 10/02/2017 CLINICAL DATA:  c/o shortness of breath. States shob began approx 30 minutes ago, "feels like something stuck in my throat". Non smoker EXAM: CHEST  2 VIEW COMPARISON:  Chest x-ray dated 07/17/2017. FINDINGS: Stable cardiomegaly. Aortic atherosclerosis. Lungs are clear. No pleural effusion or pneumothorax seen. Osseous and soft tissue  structures about the chest are unremarkable. IMPRESSION: No active cardiopulmonary disease. No evidence of pneumonia or pulmonary edema. Stable cardiomegaly. Aortic atherosclerosis. Electronically Signed   By: Franki Cabot M.D.   On: 10/02/2017 21:44    ____________________________________________   PROCEDURES  Procedure(s) performed: None  Procedures  Critical Care performed: No ____________________________________________   INITIAL IMPRESSION / ASSESSMENT AND PLAN / ED COURSE  Pertinent labs & imaging results that were available during my care of the patient were reviewed by me and considered in my medical decision making (see chart for details).  62 y.o. female with a history of hypertension, chronic cough, presenting with 30 minutes of shortness of breath with a sensation of a lump in her throat, perioral and finger tingling with hyperventilation.  Overall, the patient has a reassuring examination.  On my exam, her O2 sats are 100% on room air and she is breathing comfortably at 14-16 times per minute.  Her other vital signs are otherwise stable.  The etiology of  this episode is not completely clear.  A panic attack is possible although the patient states she has not been feeling anxious.  There is no evidence for pneumonia, pneumothorax, clinically or on her chest x-ray.  Her blood counts are normal and she is not anemic.  Her troponin is negative and her EKG does not show any ischemic change.  An intermittent arrhythmia is on the differential, but there is no captured abnormal rhythm on her EKG here.  At this time, the patient is safe for discharge home.  She understands the results of her testing and will follow up with her primary care physician in 1-2 days.  She Artie has a standing appointment to see a pulmonologist for her chronic cough on February 22nd.  Return precautions were discussed with the patient and her family  ____________________________________________  FINAL  CLINICAL IMPRESSION(S) / ED DIAGNOSES  Final diagnoses:  Shortness of breath  Hypokalemia         NEW MEDICATIONS STARTED DURING THIS VISIT:  New Prescriptions   No medications on file  Eula Listen, MD 10/02/17 2313

## 2017-10-02 NOTE — Discharge Instructions (Addendum)
Please return to the emergency department if you develop shortness of breath, lightheadedness or fainting, fever, chest pain, or any other symptoms concerning to you.

## 2017-10-02 NOTE — ED Triage Notes (Addendum)
Patient to ER for c/o shortness of breath. States shob began approx 30 minutes ago, "feels like something stuck in my throat". Patient tachypneic/hyperventilating upon arrival. Patient was watching TV at time of initial shortness of breath.

## 2018-03-09 DIAGNOSIS — G4700 Insomnia, unspecified: Secondary | ICD-10-CM | POA: Insufficient documentation

## 2018-09-17 DIAGNOSIS — I77819 Aortic ectasia, unspecified site: Secondary | ICD-10-CM | POA: Insufficient documentation

## 2018-09-17 DIAGNOSIS — Z6823 Body mass index (BMI) 23.0-23.9, adult: Secondary | ICD-10-CM | POA: Insufficient documentation

## 2018-10-07 DIAGNOSIS — H8110 Benign paroxysmal vertigo, unspecified ear: Secondary | ICD-10-CM | POA: Insufficient documentation

## 2018-10-14 IMAGING — MG MM DIGITAL SCREENING BILAT W/ TOMO W/ CAD
8 of 12 series · 8 of 28 positions shown · non-contrast
Comparison: None.

CLINICAL DATA: Screening.

EXAM:
2D DIGITAL SCREENING BILATERAL MAMMOGRAM WITH CAD AND ADJUNCT TOMO

[L MLO]
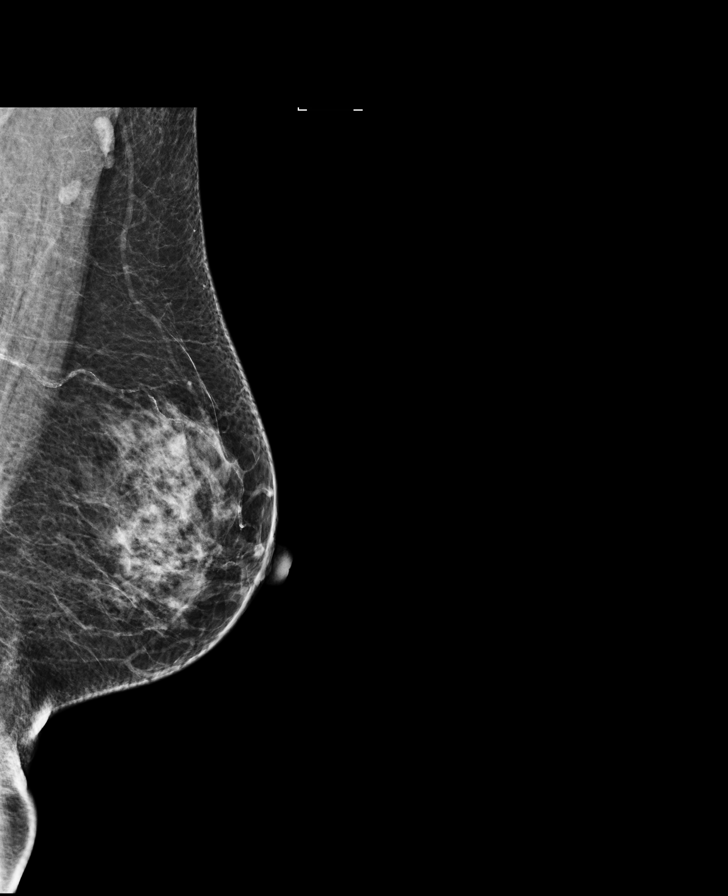

[L CC]
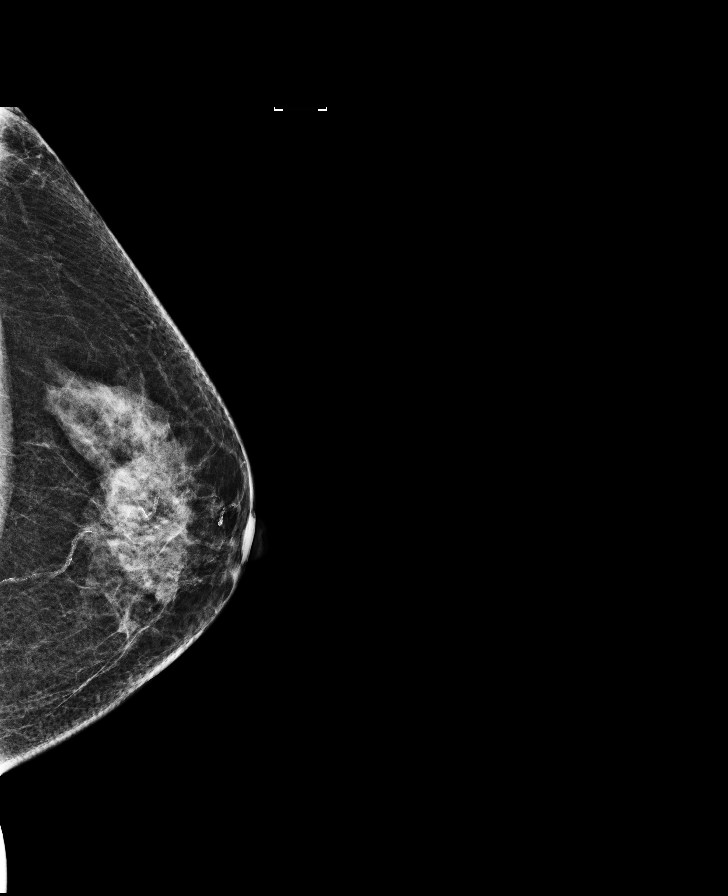

[L CC synth-2D]
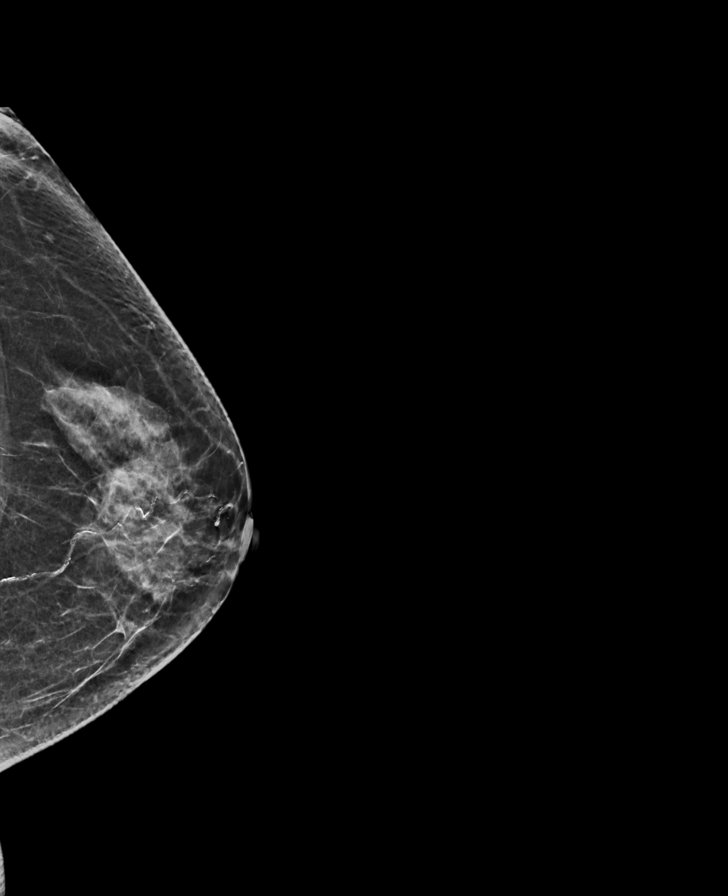

[R CC]
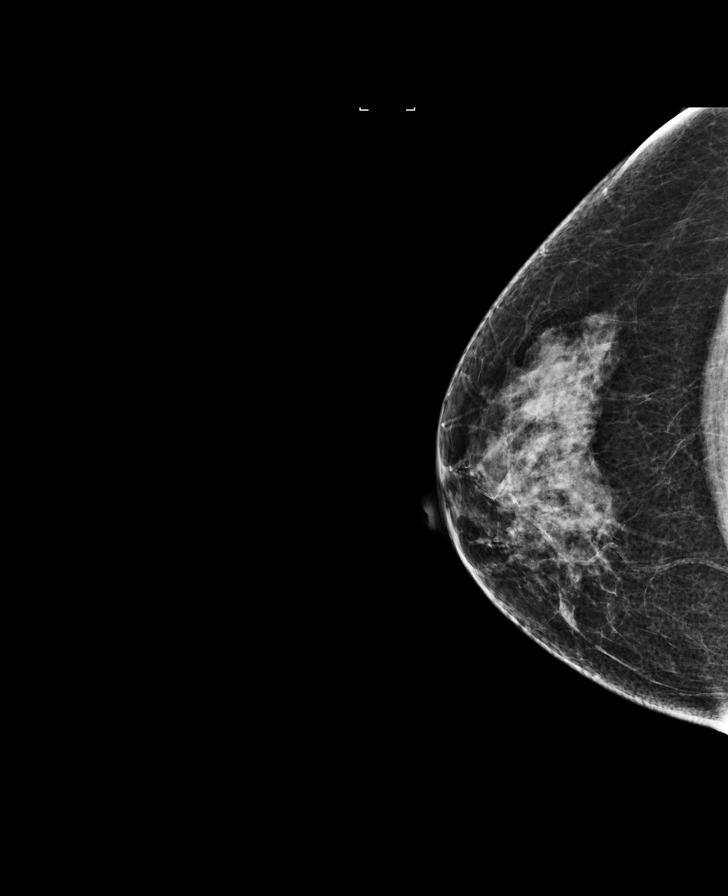

[R MLO]
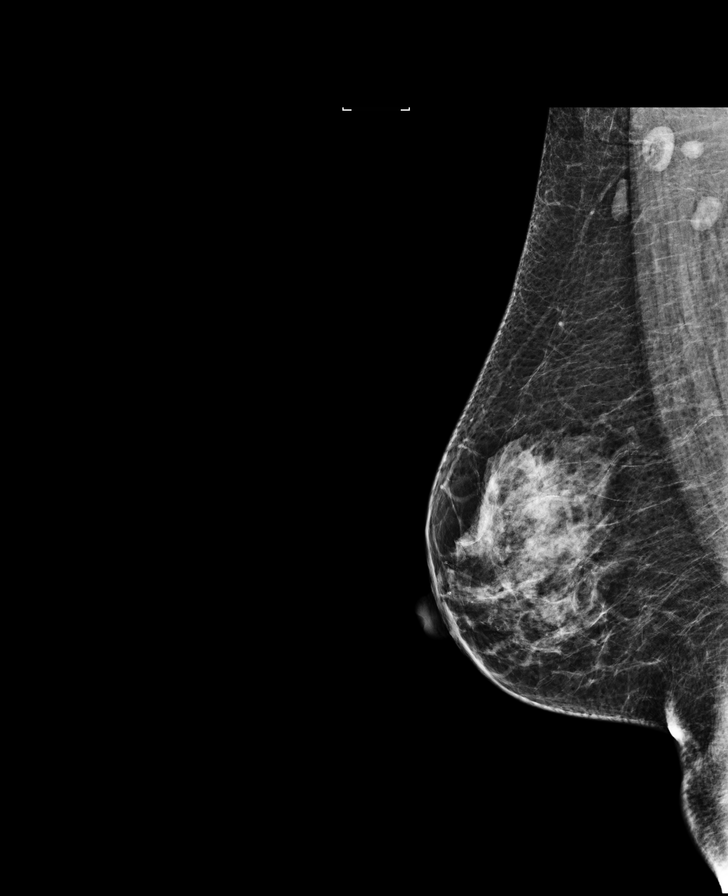

[R MLO synth-2D]
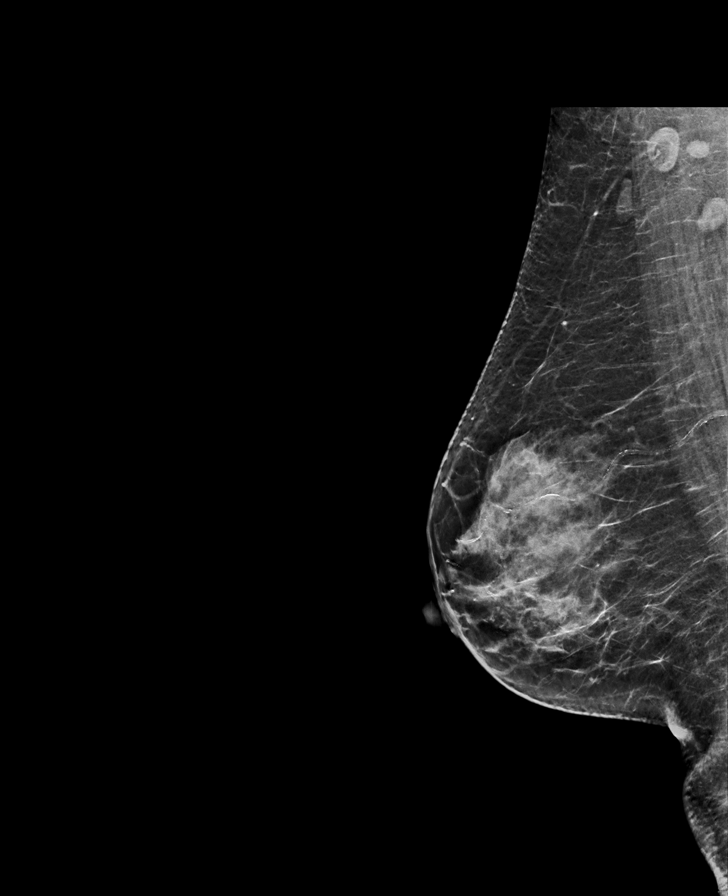

[L MLO synth-2D]
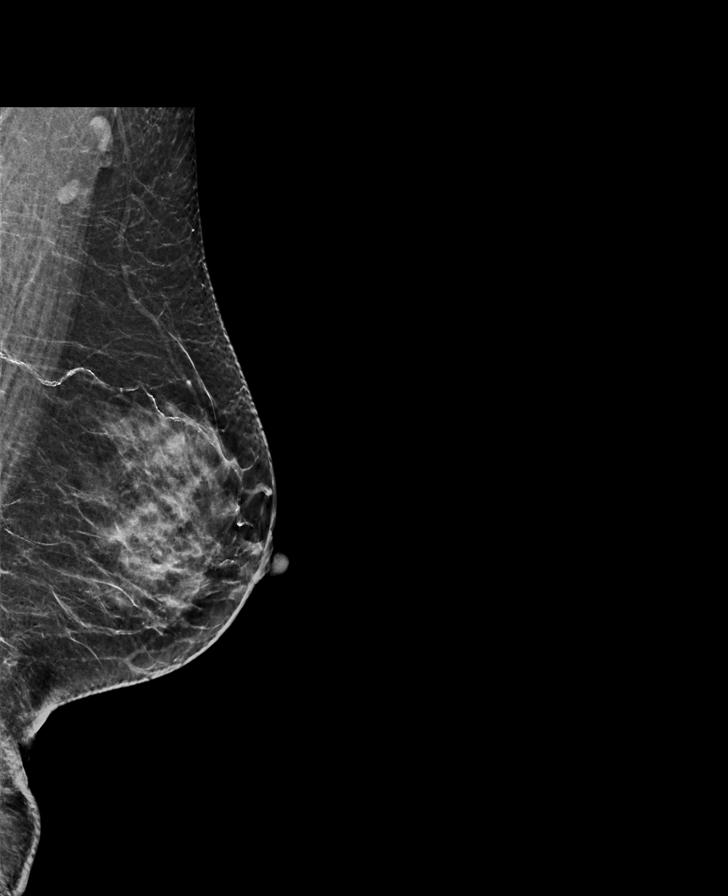

[R CC synth-2D]
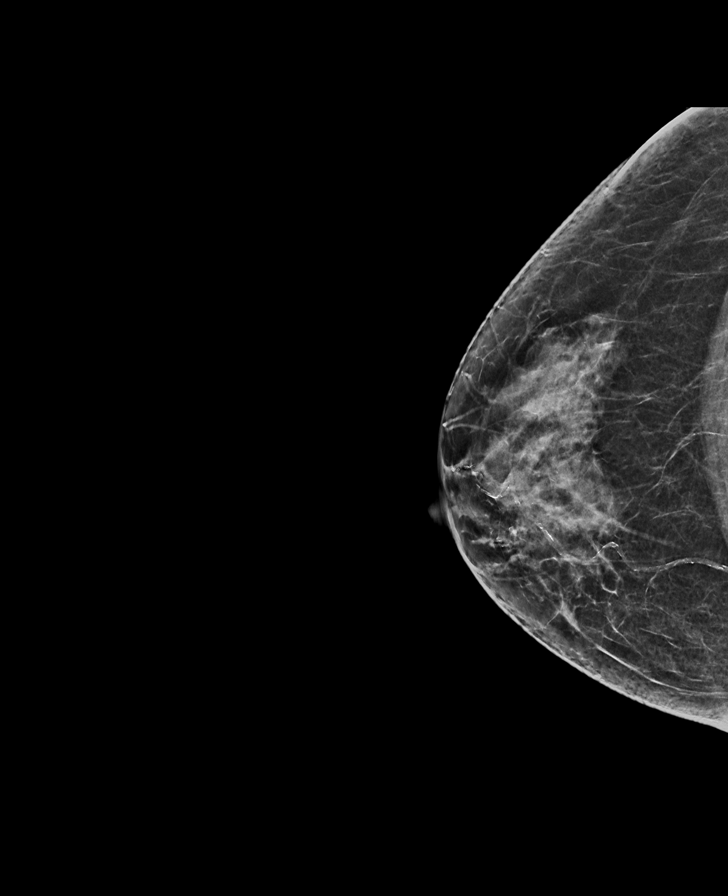

[8 of 28 positions shown; findings below may reference images not displayed]

ACR Breast Density Category c: The breast tissue is heterogeneously
dense, which may obscure small masses
FINDINGS: There are no findings suspicious for malignancy. Images were
processed with CAD.
IMPRESSION: No mammographic evidence of malignancy. A result letter of this
screening mammogram will be mailed directly to the patient.

RECOMMENDATION:
Screening mammogram in one year. (Code:KQ-2-WJT)

BI-RADS CATEGORY  1: Negative.

## 2019-04-15 ENCOUNTER — Other Ambulatory Visit: Payer: Self-pay | Admitting: Physician Assistant

## 2019-04-15 DIAGNOSIS — K1121 Acute sialoadenitis: Secondary | ICD-10-CM

## 2019-04-25 ENCOUNTER — Other Ambulatory Visit: Payer: Self-pay

## 2019-04-25 ENCOUNTER — Ambulatory Visit
Admission: RE | Admit: 2019-04-25 | Discharge: 2019-04-25 | Disposition: A | Discharge status: Home / Self Care | Payer: BLUE CROSS/BLUE SHIELD | Source: Ambulatory Visit | Attending: Physician Assistant | Admitting: Physician Assistant

## 2019-04-25 DIAGNOSIS — K1121 Acute sialoadenitis: Secondary | ICD-10-CM | POA: Diagnosis not present

## 2019-08-19 ENCOUNTER — Other Ambulatory Visit: Payer: Self-pay

## 2019-08-19 ENCOUNTER — Encounter: Payer: Self-pay | Admitting: Physical Therapy

## 2019-08-19 ENCOUNTER — Ambulatory Visit: Payer: BLUE CROSS/BLUE SHIELD | Attending: Physician Assistant | Admitting: Physical Therapy

## 2019-08-19 DIAGNOSIS — R42 Dizziness and giddiness: Secondary | ICD-10-CM | POA: Insufficient documentation

## 2019-08-19 NOTE — Therapy (Addendum)
Jamie Trevino [facility] SERVICES [address] [place], [place], [address] Phone: [phone]   Fax:  [phone]  Physical Therapy Evaluation  Patient Details  Name: Jamie Trevino MRN: [id number] Date of Birth: 02-13-1956 No data recorded  Encounter Date: 08/19/2019  PT End of Session - 08/19/19 1502    Visit Number  1    Number of Visits  13    Date for PT Re-Evaluation  11/11/19    PT Start Time  1502    PT Stop Time  1624    PT Time Calculation (min)  82 min    Activity Tolerance  Patient tolerated treatment well    Behavior During Therapy  [facility] for tasks assessed/performed       Past Medical History:  Diagnosis Date  . Hypertension     Past Surgical History:  Procedure Laterality Date  . ABDOMINAL HYSTERECTOMY    . adenomatous colon polyps  09/14/2016  . COLONOSCOPY N/A 12/22/2016   Procedure: COLONOSCOPY;  Surgeon: Manya Silvas, MD;  Location: [facility] ENDOSCOPY;  Service: Endoscopy;  Laterality: N/A;  . TUBAL LIGATION      There were no vitals filed for this visit.   Subjective Assessment - 08/21/19 1634    Subjective  Patient reports that she is frustrated that she has been having dizziness on and off and worsening recently. Patient reports that she avoids busy environments because it causes bothersomeness in her head, but denies dizziness. Patient reports that she has slowed down her movements due to er symptoms.    Patient is accompained by:  Interpreter    Pertinent History  Patient reports dizziness for the past 3-4 years. Patient reports the episodes come and go, but lately she has been having more frequent and worsening episodes. Patient describes her dizziness as vertigo and unsteadiness and pressure at the top of her head. Patient reports the vertigo lasts seconds to a few minutes. Patient reports she is getting episodes of vertigo more frequently in the last two months and she started to take Meclizine to help it go  away. Patient reports that the Meclizine helps her to not be so dizzy, but the bothersomeness in her head does not take that away. When asked to describe what bothersomeness means she denies it being headache, but rather a pressure on top of her head.Patient reports she has not had vertigo in the last week because she has been taking the Meclizine, but states she has had dizziness. Patient reports that in the mornings she feels her equilibrium is not so good and that she has to hold onto the walls for balance, which patient reports she has never had to do before. Patient reports bending over, looking up, quick movements, and when she is lying down the left ear causes dizziness. Patient reports the meclizine, alka seltzer and tried homeopathic things like ginger help reduce her symptoms. Patient reports that when she concentrates on a focal point, the vertigo will subside in seconds to a few minutes. 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 Patient states she had physical therapy last 22 days ago and she reports that therapy did not help this last time. Patient reports she saw ENT physician and and VNG was normal. Patient reports that she was told that it was not her ear this time. Patient reports she has a referred for neurology  scheduled for January 2021.    Diagnostic tests  VNG-normal per MR    Patient Stated Goals  to have decreased dizziness    Currently in Pain?  --   none stated      [facility] PT Assessment - 08/19/19 1602      Standardized Balance Assessment   Standardized Balance Assessment  Dynamic Gait Index      Dynamic Gait Index   Level Surface  Normal    Change in Gait Speed  Normal    Gait with Horizontal Head Turns  Normal    Gait with Vertical Head Turns  Mild Impairment    Gait and Pivot Turn  Moderate Impairment   step loss of balance with left turn   Step Over Obstacle  Normal    Step  Around Obstacles  Normal    Steps  Mild Impairment    Total Score  20      [facility] Interpreter present during entire session: Jackie   VESTIBULAR AND BALANCE EVALUATION   HISTORY:  Subjective history of current problem: Patient reports dizziness for the past 3-4 years. Patient reports the episodes come and go, but lately she has been having more frequent and worsening episodes. Patient describes her dizziness as vertigo and unsteadiness and pressure at the top of her head. Patient reports the vertigo lasts seconds to a few minutes. Patient reports she is getting episodes of vertigo more frequently in the last two months and she started to take Meclizine to help it go away. Patient reports that the Meclizine helps her to not be so dizzy, but the bothersomeness in her head does not take that away. When asked to describe what bothersomeness means she denies it being headache, but rather a pressure on top of her head.Patient reports she has not had vertigo in the last week because she has been taking the Meclizine, but states she has had dizziness. Patient reports that in the mornings she feels her equilibrium is not so good and that she has to hold onto the walls for balance, which patient reports she has never had to do before. Patient reports that she has slowed down her movements due to her symptoms. Patient reports bending over, looking up, quick movements, and when she is lying down the left ear causes dizziness. Patient reports the meclizine, alka seltzer and tried homeopathic things like ginger help reduce her symptoms. Patient reports that when she concentrates on a focal point, the vertigo will subside in seconds to a few minutes. 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 Patient states she had physical therapy last 22 days ago and she reports that therapy did not help this last time. Patient reports she saw  ENT physician and and VNG was normal. Patient reports that she was told that it was not her ear this time. Patient reports she has a referred for neurology scheduled for January 2021.   Patient has been seen by cardiology due to palpitations per MR.  Symptom nature: motion provoked, positional, variable, intermittent  Progression of symptoms: worse History of similar episodes: yes  Falls (yes/no): no Number of falls in past 6 months: 0  Auditory complaints (tinnitus, pain, drainage):  On the outside of the ear it gets inflamed and she had a referral to see a doctor in [place] for this issue. reports left ear is worse than right ear. Patient  reports the [facility] doctor said it was nothing. Swollen salivary gland and they checked to make sure that it was not thyroid or cancerous. Tinnitus both ears for 6 years and itching in the left ear canal Vision (last eye exam, diplopia, recent changes): patient reports that she has blurry vision and reports she had an eye exam 5 months ago and was told that she has the beginning of cataracts and she was prescribed reading glasses and was told she had dryness in the eyes.       EXAMINATION  SOMATOSENSORY:  Any N & T in extremities or weakness: denies      COORDINATION: Finger to Nose:  Normal Past Pointing:    Normal  MUSCULOSKELETAL SCREEN: Cervical Spine ROM: AROM cervical spine WNL without pain   Gait: Patient arrives ambulating without AD. Patient ambulates with fair cadence with reciprocal arm swing and step through gait pattern. Scanning of visual environment with gait is: fair  Balance: Patient is challenged by ambulation with vertical head turns, pivot turns. Will assess narrow BOS and uneven surfaces as well as EC activities next session.  POSTURAL CONTROL TESTS:  Clinical Test of Sensory Interaction for Balance (CTSIB): TBA  OCULOMOTOR / VESTIBULAR TESTING:  Oculomotor Exam- Room Light  Normal Abnormal Comments  Ocular  Alignment N    Ocular ROM N  Patient reports nausea; noted significant saccadic jumps with testing ocular ROM; patient does have full ocular ROM   Spontaneous Nystagmus N    Gaze evoked Nystagmus N    Smooth Pursuit  Abn Patient reports nausea; noted significant saccadic jumps with testing ocular ROM  Saccades N  Reports nausea and has a difficult time performing task  VOR  Abn Causes nausea and performs at slow speed, but denies dizziness and reports X is staying in focus and it recreated the sensation that it feels like a weight is on her head; noted saccadic eye movements and patient was not able to keep the target in focus.   VOR Cancellation N  "something bothers me in the back of the head" no worsening blurriness or dizziness  Left Head Impulse N    Right Head Impulse N      Oculomotor Exam- Fixation Suppressed  Normal Abnormal Comments  Ocular Alignment N    Ocular ROM N    Spontaneous Nystagmus N    Gaze evoked Nystagmus  Abn Noted right beating nystagmus with right gaze; noted with left gaze, left eye unable to hold in left gaze position and drifts back towards center-potential muscle weakness?  Head Shaking Nystagmus  Abn Noted left beating nystagmus    BPPV TESTS: TBA-deferred this dates secondary to MR revealed normal VNG test with negative Dix-Hallpike testing, but will assess next session.  FUNCTIONAL OUTCOME MEASURES:  Results Comments  DHI    42/100 Moderate perception of handicap; in need of intervention  ABC Scale      90.6% normal  DGI       20/24 Mild impairment; in need of intervention    VOR X 1 exercise:  Demonstrated and educated as to VOR X1.  Patient performed VOR X 1 horizontal in sitting 3 reps of 30 seconds each with verbal cues for technique.  Patient reports dizziness. Issued for HEP.    PT Education - 08/19/19 1502   Education: discussed plan of care and issued VOR X 1 in sitting for HEP; handout provided   Person(s) Educated  Patient    Methods   Explanation,  demonstrated, handout, verbal cues   Comprehension  Verbalized understanding, returned demonstration        PT Short Term Goals - 08/21/19 1600      PT SHORT TERM GOAL #1   Title  Patient will be able to perform home program independently for self-management and for improved function at home and work.    Time  4    Period  Weeks    Status  New    Target Date  09/16/19       PT Long Term Goals - 08/21/19 1601      PT LONG TERM GOAL #1   Title  Patient will reduce perceived disability to low levels as indicated by <40 on Dizziness Handicap Inventory.    Baseline  scored 42/100 on 08/19/19    Time  12    Period  Weeks    Status  New    Target Date  11/11/19      PT LONG TERM GOAL #2   Title  Patient will demonstrate reduced falls risk as evidenced by Dynamic Gait Index (DGI) of 22/24 or greater..    Baseline  scored 21/24 on 08/19/19    Time  12    Period  Weeks    Status  New    Target Date  11/11/19      PT LONG TERM GOAL #3   Title  Patient will report 50% or greater improvement in her symptoms of dizziness and imbalance with provoking motions or positions.    Time  12    Period  Weeks    Status  New    Target Date  11/11/19           Plan - 08/21/19 1652    Clinical Impression Statement  Patient presents to clinic with reports of recurrent bouts of dizziness for the past 3-4 years which have worsened recently. Patient seen by ENT physician and had a VNG performed which was negative per MR. Patient noted to have abnormal smooth pursuits and VOR in room light and with fixation suppressed was noted to have abnormal head shaking nystagmus with left beating nystagmus note and noted with gaze left position that left eye was unable to hold the position and drifted back towards midline which might be indicative of eye muscle weakness. Patient with potential indicators of central signs noted. Patient would benefit from PT services to try to address functional  deficits and goals as set on plan of care and to try to reduce patient's subjective symptoms.    Personal Factors and Comorbidities  Past/Current Experience;Comorbidity 1;Time since onset of injury/illness/exacerbation    Comorbidities  HTN    Examination-Activity Limitations  Bend;Reach Overhead    Stability/Clinical Decision Making  Evolving/Moderate complexity    Clinical Decision Making  Moderate    Rehab Potential  Fair    PT Frequency  1x / week    PT Duration  12 weeks    PT Treatment/Interventions  Canalith Repostioning;Gait training;Stair training;Therapeutic activities;Therapeutic exercise;Balance training;Neuromuscular re-education;Patient/family education;Vestibular    PT Next Visit Plan  modified CTSIB, canal testing, review VOR x 1    PT Home Exercise Plan  VOR X 1 30 second reps in sitting    Consulted and Agree with Plan of Care  Patient        Patient will benefit from skilled therapeutic intervention in order to improve the following deficits and impairments:  Dizziness, Decreased balance, Difficulty walking  Visit Diagnosis: Dizziness and giddiness  Problem List There are no problems to display for this patient.  Lady Deutscher PT, DPT [phone] Lady Deutscher 08/19/2019, 4:36 PM  [place] SERVICES [address] [place], [place], [address] Phone: [phone]   Fax:  [phone]  Name: Jamie Trevino MRN: [id number] Date of Birth: 10-Nov-1955

## 2019-08-26 ENCOUNTER — Encounter: Payer: Self-pay | Admitting: Physical Therapy

## 2019-08-26 ENCOUNTER — Ambulatory Visit: Payer: BLUE CROSS/BLUE SHIELD | Admitting: Physical Therapy

## 2019-08-26 ENCOUNTER — Other Ambulatory Visit: Payer: Self-pay

## 2019-08-26 DIAGNOSIS — R42 Dizziness and giddiness: Secondary | ICD-10-CM | POA: Diagnosis not present

## 2019-08-26 NOTE — Therapy (Signed)
[place] MAIN [facility] SERVICES [address] [place], [place], [address] Phone: [phone]   Fax:  [phone]  Physical Therapy Treatment  Patient Details  Name: Jamie Trevino MRN: [id number] Date of Birth: 19-Sep-1955 No data recorded  Encounter Date: 08/26/2019  PT End of Session - 08/26/19 1503    Visit Number  2    Number of Visits  13    Date for PT Re-Evaluation  11/11/19    PT Start Time  1500    PT Stop Time  1555    PT Time Calculation (min)  55 min    Equipment Utilized During Treatment  Gait belt    Activity Tolerance  Patient tolerated treatment well    Behavior During Therapy  WFL for tasks assessed/performed       Past Medical History:  Diagnosis Date  . Hypertension     Past Surgical History:  Procedure Laterality Date  . ABDOMINAL HYSTERECTOMY    . adenomatous colon polyps  09/14/2016  . COLONOSCOPY N/A 12/22/2016   Procedure: COLONOSCOPY;  Surgeon: Manya Silvas, MD;  Location: [facility] ENDOSCOPY;  Service: Endoscopy;  Laterality: N/A;  . TUBAL LIGATION      There were no vitals filed for this visit.  Subjective Assessment - 08/26/19 1502    Subjective  Patient reports she did the VOR X 1 exercise and states it went well. Patient states yesterday and today she has been dizzy. Patient reports that when her symptoms first began she was having vertigo and that she slept in a chair for 2 months because she got vertigo when she laid down.    Patient is accompained by:  Interpreter    Pertinent History  Patient reports dizziness for the past 3-4 years. Patient reports the episodes come and go, but lately she has been having more frequent and worsening episodes. Patient describes her dizziness as vertigo and unsteadiness and pressure at the top of her head. Patient reports the vertigo lasts seconds to a few minutes. Patient reports she is getting episodes of vertigo more frequently in the last two months and she started  to take Meclizine to help it go away. Patient reports that the Meclizine helps her to not be so dizzy, but the bothersomeness in her head does not take that away. When asked to describe what bothersomeness means she denies it being headache, but rather a pressure on top of her head.Patient reports she has not had vertigo in the last week because she has been taking the Meclizine, but states she has had dizziness. Patient reports that in the mornings she feels her equilibrium is not so good and that she has to hold onto the walls for balance, which patient reports she has never had to do before. Patient reports bending over, looking up, quick movements, and when she is lying down the left ear causes dizziness. Patient reports the meclizine, alka seltzer and tried homeopathic things like ginger help reduce her symptoms. Patient reports that when she concentrates on a focal point, the vertigo will subside in seconds to a few minutes. 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 Patient states she had physical therapy last 22 days ago and she reports that therapy did not help this last time. Patient reports she saw ENT physician and and VNG was normal. Patient reports that she was told that it  was not her ear this time. Patient reports she has a referred for neurology scheduled for January 2021.    Diagnostic tests  VNG-normal per MR    Patient Stated Goals  to have decreased dizziness    Currently in Pain?  No/denies      Patient reports that when her symptoms first began she was having vertigo and that she slept in a chair for 2 months because she got vertigo when she laid down.    Neuromuscular Re-education: VOR X 1 exercise:  Reeducated as to VOR X 1. Patient performed VOR X 1 horizontal in sitting 3 reps of 1 minute each with verbal cues for technique.  Patient denied dizziness, but states her eyes got blurry and  watery. Progressed to 1 minute reps in sitting for HEP.  Ambulation with head turns:  Patient performed 175' forwards and retro ambulation while scanning for targets in hallway with CGA.  Patient performed 175' trials of forwards and retro ambulation with horizontal and vertical head turns with CGA for horizontal and Min A for vertical head turns.   Patient demonstrates significant veering and stopping, increased dizziness, vertigo with vertical head turns.   After neuromuscular re-education performed canalith repositioning maneuver.   Canalith Repositioning Maneuver: On mat table, performed right Dix-Hallpike testing and it was positive with several beats of right upbeating torsional nystagmus of short duration without latency. Performed right canalith repositioning maneuver (CRT). Repeated right CRT for a total of 2 maneuvers with retesting between maneuvers. Did not observe nystagmus with second maneuver but patient reported dizziness/vertigo. Patient reported 2-3/10 vertigo and mild nausea with CRT.   Performed left Dix-Hallpike test and it was negative with no nystagmus observed and patient denied vertigo.      PT Education - 08/26/19 1503    Education Details  discussed BPPV and handout from [email or web address] provided; discussed Epley maneuver and progressed HEP to VOR X 1 for 60 second reps.    Person(s) Educated  Patient    Methods  Explanation;Handout;Verbal cues    Comprehension  Verbalized understanding       PT Short Term Goals - 08/21/19 1600      PT SHORT TERM GOAL #1   Title  Patient will be able to perform home program independently for self-management and for improved function at home and work.    Time  4    Period  Weeks    Status  New    Target Date  09/16/19        PT Long Term Goals - 08/21/19 1601      PT LONG TERM GOAL #1   Title  Patient will reduce perceived disability to low levels as indicated by <40 on Dizziness Handicap Inventory.    Baseline   scored 42/100 on 08/19/19    Time  12    Period  Weeks    Status  New    Target Date  11/11/19      PT LONG TERM GOAL #2   Title  Patient will demonstrate reduced falls risk as evidenced by Dynamic Gait Index (DGI) of 22/24 or greater..    Baseline  scored 21/24 on 08/19/19    Time  12    Period  Weeks    Status  New    Target Date  11/11/19      PT LONG TERM GOAL #3   Title  Patient will report 50% or greater improvement in her symptoms of dizziness and imbalance with provoking motions  or positions.    Time  12    Period  Weeks    Status  New    Target Date  11/11/19            Plan - 08/26/19 1620    Clinical Impression Statement  Patient noted to have significant imbalance and patient reported vertigo with ambulation with vertical head turns when she would look up. Performed Dix-Hallpike testing and it was positive for right side. Performed 2 Epley maneuvers. Will plan on repeating Dix-Hallpike tests next session and performing Epley maneuvers if indicated until clearance of particles.    Personal Factors and Comorbidities  Past/Current Experience;Comorbidity 1;Time since onset of injury/illness/exacerbation    Comorbidities  HTN    Examination-Activity Limitations  Bend;Reach Overhead    Stability/Clinical Decision Making  Evolving/Moderate complexity    Rehab Potential  Fair    PT Frequency  1x / week    PT Duration  12 weeks    PT Treatment/Interventions  Canalith Repostioning;Gait training;Stair training;Therapeutic activities;Therapeutic exercise;Balance training;Neuromuscular re-education;Patient/family education;Vestibular    PT Next Visit Plan  modified CTSIB, canal testing, review VOR x 1    PT Home Exercise Plan  VOR X 1 30 second reps in sitting    Consulted and Agree with Plan of Care  Patient       Patient will benefit from skilled therapeutic intervention in order to improve the following deficits and impairments:  Dizziness, Decreased balance, Difficulty  walking  Visit Diagnosis: Dizziness and giddiness     Problem List There are no problems to display for this patient.  Lady Deutscher PT, DPT [phone] Lady Deutscher 08/26/2019, 4:37 PM  [place] MAIN [facility] SERVICES [address] [place], [place], [address] Phone: [phone]   Fax:  [phone]  Name: Ganell Galanos MRN: [id number] Date of Birth: 1956/05/15

## 2019-09-02 ENCOUNTER — Ambulatory Visit: Payer: BLUE CROSS/BLUE SHIELD | Admitting: Physical Therapy

## 2019-09-08 ENCOUNTER — Other Ambulatory Visit: Payer: Self-pay | Admitting: Family Medicine

## 2019-09-09 ENCOUNTER — Ambulatory Visit: Payer: BLUE CROSS/BLUE SHIELD | Attending: Physician Assistant | Admitting: Physical Therapy

## 2019-09-12 ENCOUNTER — Other Ambulatory Visit: Payer: Self-pay | Admitting: Family Medicine

## 2019-09-12 DIAGNOSIS — Z1231 Encounter for screening mammogram for malignant neoplasm of breast: Secondary | ICD-10-CM

## 2019-09-12 DIAGNOSIS — N644 Mastodynia: Secondary | ICD-10-CM

## 2019-09-15 ENCOUNTER — Other Ambulatory Visit: Payer: Self-pay | Admitting: Family Medicine

## 2019-09-15 DIAGNOSIS — N644 Mastodynia: Secondary | ICD-10-CM

## 2019-09-15 DIAGNOSIS — Z1231 Encounter for screening mammogram for malignant neoplasm of breast: Secondary | ICD-10-CM

## 2019-09-16 ENCOUNTER — Ambulatory Visit: Payer: BLUE CROSS/BLUE SHIELD | Admitting: Physical Therapy

## 2019-09-22 ENCOUNTER — Ambulatory Visit
Admission: RE | Admit: 2019-09-22 | Discharge: 2019-09-22 | Disposition: A | Discharge status: Home / Self Care | Payer: Medicaid Other | Source: Ambulatory Visit | Attending: Family Medicine | Admitting: Family Medicine

## 2019-09-22 DIAGNOSIS — Z1231 Encounter for screening mammogram for malignant neoplasm of breast: Secondary | ICD-10-CM | POA: Insufficient documentation

## 2019-09-22 DIAGNOSIS — N644 Mastodynia: Secondary | ICD-10-CM

## 2019-09-23 ENCOUNTER — Ambulatory Visit: Payer: BLUE CROSS/BLUE SHIELD | Admitting: Physical Therapy

## 2019-10-07 ENCOUNTER — Ambulatory Visit: Payer: Medicaid Other | Admitting: Physical Therapy

## 2019-10-14 ENCOUNTER — Ambulatory Visit: Payer: Medicaid Other | Attending: Physician Assistant | Admitting: Physical Therapy

## 2019-10-21 ENCOUNTER — Encounter: Payer: BLUE CROSS/BLUE SHIELD | Admitting: Physical Therapy

## 2019-10-22 ENCOUNTER — Emergency Department: Payer: Medicaid Other

## 2019-10-22 ENCOUNTER — Emergency Department
Admission: EM | Admit: 2019-10-22 | Discharge: 2019-10-23 | Disposition: A | Discharge status: Home / Self Care | Payer: Medicaid Other | Attending: Emergency Medicine | Admitting: Emergency Medicine

## 2019-10-22 ENCOUNTER — Other Ambulatory Visit: Payer: Self-pay

## 2019-10-22 DIAGNOSIS — R079 Chest pain, unspecified: Secondary | ICD-10-CM | POA: Diagnosis present

## 2019-10-22 DIAGNOSIS — R0789 Other chest pain: Secondary | ICD-10-CM | POA: Insufficient documentation

## 2019-10-22 DIAGNOSIS — I10 Essential (primary) hypertension: Secondary | ICD-10-CM | POA: Diagnosis not present

## 2019-10-22 DIAGNOSIS — Z79899 Other long term (current) drug therapy: Secondary | ICD-10-CM | POA: Insufficient documentation

## 2019-10-22 LAB — CBC
HCT: 39.4 % (ref 36.0–46.0)
Hemoglobin: 12.4 g/dL (ref 12.0–15.0)
MCH: 26.2 pg (ref 26.0–34.0)
MCHC: 31.5 g/dL (ref 30.0–36.0)
MCV: 83.3 fL (ref 80.0–100.0)
Platelets: 341 10*3/uL (ref 150–400)
RBC: 4.73 MIL/uL (ref 3.87–5.11)
RDW: 13.9 % (ref 11.5–15.5)
WBC: 9.4 10*3/uL (ref 4.0–10.5)
nRBC: 0 % (ref 0.0–0.2)

## 2019-10-22 NOTE — ED Triage Notes (Signed)
Pt to ED via EMS from home. Pt states she started having intermittent chest pain 3 days ago and has gotten worse. Pt states pain radiates to left arm, pt has hx of HTN, 324 aspirin given by ems.

## 2019-10-22 NOTE — ED Provider Notes (Signed)
[facility] Emergency Department Provider Note   ____________________________________________    I have reviewed the triage vital signs and the nursing notes.   HISTORY  Chief Complaint Chest Pain     HPI Jamie Trevino is a 64 y.o. female with a history of hypertension presents with complaints of chest pain.  She notes this started 3 days ago and was intermittent at the time but has become worse and more regular now.  Occasionally the pain travels to her left arm.  She did receive aspirin via EMS.  She denies shortness of breath.  No pleurisy.  No fevers chills or cough.  No sick contacts.  No calf pain or swelling.  No recent travel.  No history of blood clots.  Does not smoke.  Past Medical History:  Diagnosis Date  . Hypertension     There are no problems to display for this patient.   Past Surgical History:  Procedure Laterality Date  . ABDOMINAL HYSTERECTOMY    . adenomatous colon polyps  09/14/2016  . COLONOSCOPY N/A 12/22/2016   Procedure: COLONOSCOPY;  Surgeon: Manya Silvas, MD;  Location: [facility] ENDOSCOPY;  Service: Endoscopy;  Laterality: N/A;  . TUBAL LIGATION      Prior to Admission medications   Medication Sig Start Date End Date Taking? Authorizing Provider  amLODipine (NORVASC) 10 MG tablet Take 10 mg by mouth daily.    [provider]  hydrALAZINE (APRESOLINE) 25 MG tablet Take 25 mg by mouth 3 (three) times daily.    [provider]  HYDROcodone-acetaminophen (NORCO/VICODIN) 5-325 MG tablet Take 1 tablet by mouth every 6 (six) hours as needed for moderate pain. Patient not taking: Reported on 12/22/2016 10/29/15   Delman Kitten, MD  levofloxacin (LEVAQUIN) 750 MG tablet Take 1 tablet (750 mg total) by mouth daily. Patient not taking: Reported on 12/22/2016 10/29/15   Delman Kitten, MD  losartan (COZAAR) 100 MG tablet Take 100 mg by mouth daily.    [provider]  meclizine (ANTIVERT) 12.5 MG tablet  Take 12.5 mg by mouth 3 (three) times daily as needed for dizziness.    [provider]  metoprolol succinate (TOPROL-XL) 100 MG 24 hr tablet Take 100 mg by mouth daily. Take with or immediately following a meal.    [provider]     Allergies Patient has no known allergies.  Family History  Problem Relation Age of Onset  . Prostate cancer Father   . Breast cancer Neg Hx     Social History Social History   Tobacco Use  . Smoking status: Never Smoker  . Smokeless tobacco: Never Used  Substance Use Topics  . Alcohol use: No  . Drug use: No    Review of Systems  Constitutional: No fever/chills Eyes: No visual changes.  ENT: No sore throat. Cardiovascular: As above Respiratory: Denies shortness of breath. Gastrointestinal: No abdominal pain.  No nausea, no vomiting.   Genitourinary: Negative for dysuria. Musculoskeletal: Negative for back pain. Skin: Negative for rash. Neurological: Negative for headaches or weakness   ____________________________________________   PHYSICAL EXAM:  VITAL SIGNS: ED Triage Vitals  Enc Vitals Group     BP 10/22/19 2335 (!) 180/78     Pulse Rate 10/22/19 2333 82     Resp 10/22/19 2333 (!) 21     Temp 10/22/19 2335 98.3 F (36.8 C)     Temp Source 10/22/19 2333 Oral     SpO2 10/22/19 2333 99 %  Weight 10/22/19 2332 81.6 kg (180 lb)     Height 10/22/19 2332 1.6 m (5\' 3" )     Head Circumference --      Peak Flow --      Pain Score 10/22/19 2331 8     Pain Loc --      Pain Edu? --      Excl. in [place]? --     Constitutional: Alert and oriented.   Nose: No congestion/rhinnorhea. Mouth/Throat: Mucous membranes are moist.   Neck:  Painless ROM Cardiovascular: Normal rate, regular rhythm. Grossly normal heart sounds.  Good peripheral circulation. Respiratory: Normal respiratory effort.  No retractions. Lungs CTAB. Gastrointestinal: Soft and nontender. No distention.    Musculoskeletal: No lower extremity  tenderness nor edema.  Warm and well perfused Neurologic:  Normal speech and language. No gross focal neurologic deficits are appreciated.  Skin:  Skin is warm, dry and intact. No rash noted. Psychiatric: Mood and affect are normal. Speech and behavior are normal.  ____________________________________________   LABS (all labs ordered are listed, but only abnormal results are displayed)  Labs Reviewed  BASIC METABOLIC PANEL - Abnormal; Notable for the following components:      Result Value   Glucose, Bld 159 (*)    Calcium 8.8 (*)    All other components within normal limits  CBC  TROPONIN I (HIGH SENSITIVITY)  TROPONIN I (HIGH SENSITIVITY)   ____________________________________________  EKG  ED ECG REPORT I, Lavonia Drafts, the attending physician, personally viewed and interpreted this ECG.  Date: 10/22/2019  Rhythm: normal sinus rhythm QRS Axis: normal Intervals: normal ST/T Wave abnormalities: ST depressions inferiorly   ____________________________________________  RADIOLOGY  Chest x-ray ____________________________________________   PROCEDURES  Procedure(s) performed: No  Procedures   Critical Care performed: No ____________________________________________   INITIAL IMPRESSION / ASSESSMENT AND PLAN / ED COURSE  Pertinent labs & imaging results that were available during my care of the patient were reviewed by me and considered in my medical decision making (see chart for details).  Patient presents with chest pain as described above, EKG with nonspecific ST changes, pending troponin.  Does have tenderness to palpation of her chest wall, possible musculoskeletal.  No pleurisy to suggest PE  Troponin negative x2  Patient improvement with Toradol given hypertension sent for aorta study which was reassuring.  Discussed with patient reassuring results and the need for close follow-up with cardiology strict return precautions if return of her pain      ____________________________________________   FINAL CLINICAL IMPRESSION(S) / ED DIAGNOSES  Final diagnoses:  Atypical chest pain        Note:  This document was prepared using Dragon voice recognition software and may include unintentional dictation errors.   Lavonia Drafts, MD 10/23/19 [phone]

## 2019-10-23 ENCOUNTER — Encounter: Payer: Self-pay | Admitting: Radiology

## 2019-10-23 ENCOUNTER — Emergency Department: Payer: Medicaid Other

## 2019-10-23 LAB — BASIC METABOLIC PANEL
Anion gap: 9 (ref 5–15)
BUN: 21 mg/dL (ref 8–23)
CO2: 24 mmol/L (ref 22–32)
Calcium: 8.8 mg/dL — ABNORMAL LOW (ref 8.9–10.3)
Chloride: 108 mmol/L (ref 98–111)
Creatinine, Ser: 0.71 mg/dL (ref 0.44–1.00)
GFR calc Af Amer: >60 mL/min (ref >60)
GFR calc non Af Amer: >60 mL/min (ref >60)
Glucose, Bld: 159 mg/dL — ABNORMAL HIGH (ref 70–99)
Potassium: 3.9 mmol/L (ref 3.5–5.1)
Sodium: 141 mmol/L (ref 135–145)

## 2019-10-23 LAB — TROPONIN I (HIGH SENSITIVITY)
Troponin I (High Sensitivity): 8 ng/L (ref <18)
Troponin I (High Sensitivity): 9 ng/L (ref <18)

## 2019-10-23 MED ORDER — KETOROLAC TROMETHAMINE 30 MG/ML IJ SOLN
30.0000 mg | Freq: Once | INTRAMUSCULAR | Status: AC
  Administered 2019-10-23: 30 mg via INTRAVENOUS
  Filled 2019-10-23: qty 1

## 2019-10-23 MED ORDER — IOHEXOL 350 MG/ML SOLN
75.0000 mL | Freq: Once | INTRAVENOUS | Status: AC | PRN
  Administered 2019-10-23: 75 mL via INTRAVENOUS

## 2019-11-07 DIAGNOSIS — R42 Dizziness and giddiness: Secondary | ICD-10-CM | POA: Insufficient documentation

## 2019-11-07 DIAGNOSIS — H9190 Unspecified hearing loss, unspecified ear: Secondary | ICD-10-CM | POA: Insufficient documentation

## 2020-02-17 ENCOUNTER — Other Ambulatory Visit: Payer: Self-pay | Admitting: Neurology

## 2020-02-17 DIAGNOSIS — R4189 Other symptoms and signs involving cognitive functions and awareness: Secondary | ICD-10-CM

## 2020-02-17 DIAGNOSIS — R42 Dizziness and giddiness: Secondary | ICD-10-CM

## 2020-02-26 DIAGNOSIS — Z8619 Personal history of other infectious and parasitic diseases: Secondary | ICD-10-CM | POA: Insufficient documentation

## 2020-03-02 ENCOUNTER — Other Ambulatory Visit: Payer: Self-pay

## 2020-03-02 ENCOUNTER — Ambulatory Visit: Payer: Medicaid Other | Attending: Neurology | Admitting: Physical Therapy

## 2020-03-02 ENCOUNTER — Encounter: Payer: Self-pay | Admitting: Physical Therapy

## 2020-03-02 DIAGNOSIS — R2681 Unsteadiness on feet: Secondary | ICD-10-CM | POA: Insufficient documentation

## 2020-03-02 DIAGNOSIS — R42 Dizziness and giddiness: Secondary | ICD-10-CM | POA: Diagnosis present

## 2020-03-02 NOTE — Therapy (Signed)
[place] MAIN [facility] SERVICES [address] [place], [place], [address] Phone: [phone]   Fax:  [phone]  Physical Therapy Evaluation  Patient Details  Name: Jamie Trevino MRN: [id number] Date of Birth: [birth year] Referring Provider (PT): Dr. Manuella Ghazi   Encounter Date: 03/02/2020   PT End of Session - 03/02/20 0900    Visit Number 1    Number of Visits 9    Date for PT Re-Evaluation 04/27/20    PT Start Time 0816    Activity Tolerance Patient tolerated treatment well    Behavior During Therapy [facility] for tasks assessed/performed           Past Medical History:  Diagnosis Date  . Hypertension     Past Surgical History:  Procedure Laterality Date  . ABDOMINAL HYSTERECTOMY    . adenomatous colon polyps  09/14/2016  . COLONOSCOPY N/A 12/22/2016   Procedure: COLONOSCOPY;  Surgeon: Manya Silvas, MD;  Location: [facility] ENDOSCOPY;  Service: Endoscopy;  Laterality: N/A;  . TUBAL LIGATION      There were no vitals filed for this visit.        [facility] PT Assessment - 03/02/20 0905      Assessment   Medical Diagnosis dizziness and giddiness    Referring Provider (PT) Dr. Manuella Ghazi    Prior Therapy vestibular therapy at ENT office; was seen twice at this clinic but patient self discharged      Precautions   Precautions Fall      Restrictions   Weight Bearing Restrictions No      Balance Screen   Has the patient fallen in the past 6 months No    Has the patient had a decrease in activity level because of a fear of falling?  No   changes to her activities but performs   Is the patient reluctant to leave their home because of a fear of falling?  No      Prior Function   Level of Independence Independent with community mobility without device      Cognition   Overall Cognitive Status Within Functional Limits for tasks assessed             Subjective Assessment - 03/04/20 1510    Subjective Patient states she is mostly  getting vertigo now when she bends down to try to pick things up. Patient reports that she adjusts and slows down her movements to try to reduce or avoid her symptoms.    Patient is accompained by: Interpreter    Pertinent History Patient reports that she continues with the issue in her head and "I have the dizziness". Patient reports bending down, lying down, rolling over and look up causes dizziness and heaviness in her head. Patient reports if she is trying to get up after lying down she feels she is losing her balance. Patient reports vertigo that lasts a short time. Patient reports that in the beginning she would sleep sitting up because the dizziness was very strong. Patient reports that now she feels she is losing her balance. Patient reports that she veers when she walks. Patient reports at first she went to the ENT doctor and was getting treatment for the dizziness, but states it keeps coming back. They told her it was not coming from the inner ear and she was referred to the neurologist. Patient reports she is getting episodes of dizziness and also states the dizziness is always there and needs to take pills "so  that is why they are looking at my head". Patient reports that now the problem is more when she bends down to try to pick up something. Patient reports she has been laid off work but is scheduled to start back next week. 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 Patient reports that she used to have difficulty with cleaning the mirrors as it would cause dizziness when she would look up. Patient reports she feels pressure in the top back of her head. Scheduled for a brain MRI tomorrow.    Diagnostic tests VNG-normal per MR    Patient Stated Goals to have decreased dizziness           VESTIBULAR AND BALANCE EVALUATION   HISTORY:  Subjective history of current problem: History obtained by patient and  interpreter.  Patient reports that she continues with the issue in her head and "I have the dizziness". Patient reports bending down, lying down, rolling over and look up causes dizziness and heaviness in her head. Patient reports if she is trying to get up after lying down she feels she is losing her balance. Patient reports vertigo that lasts a short time. Patient reports that in the beginning she would sleep sitting up because the dizziness was very strong. Patient reports that now she feels she is losing her balance. Patient reports that she veers when she walks. Patient reports at first she went to the ENT doctor and was getting treatment for the dizziness, but states it keeps coming back. They told her it was not coming from the inner ear and she was referred to the neurologist. Patient reports she is getting episodes of dizziness and also states the dizziness is always there and needs to take pills "so that is why they are looking at my head". Patient reports that now the problem is more when she bends down to try to pick up something. Patient reports she has been laid off work but is scheduled to start back next week. 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 Patient reports that she used to have difficulty with cleaning the mirrors as it would cause dizziness when she would look up. Patient reports she feels pressure in the top back of her head. Scheduled for a brain MRI tomorrow.   Notes from Vestibular evaluation on 08/18/2020: Patient reports dizziness for the past 3-4 years. Patient reports the episodes come and go, but lately she has been having more frequent and worsening episodes. Patient describes her dizziness as vertigo and unsteadiness and pressure at the top of her head. Patient reports the vertigo lasts seconds to a few minutes. Patient reports she is getting episodes of vertigo more frequently in the last two  months and she started to take Meclizine to help it go away. Patient reports that the Meclizine helps her to not be so dizzy, but the bothersomeness in her head does not take that away. When asked to describe what bothersomeness means she denies it being headache, but rather a pressure on top of her head.Patient reports she has not had vertigo in the last week because she has been taking the Meclizine, but states she has had dizziness. Patient reports that in the mornings she feels her equilibrium is not so good and that she has to hold onto the walls for balance, which patient reports she  has never had to do before. Patient reports that she has slowed down her movements due to her symptoms. Patient reports bending over, looking up, quick movements, and when she is lying down the left ear causes dizziness. Patient reports the meclizine, alka seltzer and tried homeopathic things like ginger help reduce her symptoms. Patient reports that when she concentrates on a focal point, the vertigo will subside in seconds to a few minutes. 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 Patient states she had physical therapy last 22 days ago and she reports that therapy did not help this last time. Patient reports she saw ENT physician and and VNG was normal. Patient reports that she was told that it was not her ear this time. Patient reports she has a referred for neurology scheduled for January 2021.   Patient has been seen by cardiology due to palpitations per MR.  Symptom nature: motion provoked, positional, variable, intermittent  Description of dizziness: vertigo, unsteadiness Symptom nature: motion provoked, positional, intermittent  Progression of symptoms: a little bit better History of similar episodes: yes Falls (yes/no): no Number of falls in past 6 months: 0 falls; a few near misses.    Auditory complaints  (tinnitus, pain, drainage):  Per Vestibular evaluation 08/19/2019: On the outside of the ear it gets inflamed and she had a referral to see a doctor in [place] for this issue. reports left ear is worse than right ear. Patient reports the [facility] doctor said it was nothing. Swollen salivary gland and they checked to make sure that it was not thyroid or cancerous. Tinnitus both ears for 6 years and itching in the left ear canal Vision (last eye exam, diplopia, recent changes): patient reports she sees spots and the opthamoligist said it was the beginning of cataracts. Denies blurred vision and double vision.   EXAMINATION  POSTURE: rounded shoulders   MUSCULOSKELETAL SCREEN: Cervical Spine ROM: WFL cervical spine flexion, extension and right and left rotation  Gait: Patient arrives ambulating without AD. Patient ambulates with slow cadence with fair scanning of visual environment and noted one episode of veering.  Balance: TBA  POSTURAL CONTROL TESTS:  Clinical Test of Sensory Interaction for Balance (CTSIB): TBA  OCULOMOTOR / VESTIBULAR TESTING: Oculomotor Exam- Room Light  Normal Abnormal Comments  Ocular Alignment N    Ocular ROM N    Spontaneous Nystagmus N    Gaze evoked Nystagmus   Deferred secondary to positive left Dix-Hallpike  Smooth Pursuit   Deferred secondary to positive left Dix-Hallpike    Saccades   Deferred secondary to positive left Dix-Hallpike  VOR   Deferred secondary to positive left Dix-Hallpike  VOR Cancellation   Deferred secondary to positive left Dix-Hallpike  Left Head Impulse   Deferred secondary to positive left Dix-Hallpikee  Right Head Impulse   Deferred secondary to positive left Dix-Hallpike    BPPV TESTS:  Symptoms Duration Intensity Nystagmus  Left Dix-Hallpike vertigo 10 seconds or less 5-6/10 Left, upbeating torsional nystagmus of short duration less than 10 seconds  Right Dix-Hallpike None  N/A None observed; Patient reports pressure  in her head when she sits back upright.     FUNCTIONAL OUTCOME MEASURES:  Results Comments  DHI 34/100 Low perception of handicap  ABC Scale 71.3% Falls risk; in need of intervention  DGI TBA deferred  FOTO 65/100 Given the patient's risk adjustment variables, like-patients  nationally are projected to improve by 7 points or more    Canalith Repositioning Maneuver: On mat table, performed left Dix-Hallpike testing and it was positive with left upbeating torsional nystagmus of short duration without latency.  Performed left canalith repositioning maneuver (CRT). Repeated left CRT for a total of 2 maneuvers with retesting between maneuvers. Patient reported 5-6/10 vertigo with first and reported no vertigo with second maneuver and no nystagmus was observed on second test.      PT Education - 03/02/20 0901    Education Details discuccsed BPPV and Epley maneuver.    Person(s) Educated Patient    Methods Explanation;Verbal cues    Comprehension Verbalized understanding            PT Short Term Goals - 03/02/20 0901      PT SHORT TERM GOAL #1   Title Patient will be able to perform home program independently for self-management and for improved function at home and work.    Time 4    Period Weeks    Status New    Target Date 03/30/20             PT Long Term Goals - 03/04/20 1451      PT LONG TERM GOAL #1   Title Patient will reduce falls risk as indicated by Activities Specific Balance Confidence Scale (ABC) 80% or greater.    Baseline scored 71.3 on 03/02/20    Time 8    Period Weeks    Status New    Target Date 04/27/20      PT LONG TERM GOAL #2   Title Patient will have improved FOTO score of 5 points or more to indicate improved ability to perform ADLs and walking in her daily life.    Baseline scored 65/100 on 03/02/2020    Time 8    Period Weeks    Status New    Target Date 04/27/20      PT LONG TERM GOAL #3   Title Patient will report 50% or greater  improvement in her symptoms of dizziness and imbalance with provoking motions or positions.    Time 8    Period Weeks    Status New    Target Date 04/27/20             Plan - 03/04/20 1513    Clinical Impression Statement Patient known to this evaluator. Patient was last seen in the clinic in December 2020 related to dizziness symptoms. At that time, patient had abnormal smooth pursuits, VOR and with fixation suprressed had abnormal head shaking nysatgmus with left beating and gaze evoked nystagmus. Patient with a 3-4 year history of episodes of dizziness and vertigo. Patient this date found to have positive left posterior canal Dix-Hallpike testing. Performed two Epley maneuvers to try to obtain clearance of particles. Patient would benefit from PT services to try to address functional deficits and goals as set on plan of care.    Personal Factors and Comorbidities Past/Current Experience;Comorbidity 1;Time since onset of injury/illness/exacerbation    Comorbidities HTN    Examination-Activity Limitations Bend;Reach Overhead    Examination-Participation Restrictions Cleaning    Stability/Clinical Decision Making Evolving/Moderate complexity    Clinical Decision Making Moderate    Rehab Potential Fair    PT Frequency 1x / week    PT Duration 8 weeks    PT Treatment/Interventions Canalith Repostioning;Gait training;Stair training;Therapeutic activities;Therapeutic exercise;Balance training;Neuromuscular re-education;Patient/family education;Vestibular    PT Next Visit Plan canal testing until clearance of  particles is obtained; then complete vestibulo-ocular exam    Consulted and Agree with Plan of Care Patient            Patient will benefit from skilled therapeutic intervention in order to improve the following deficits and impairments:     Visit Diagnosis: Dizziness and giddiness  Unsteadiness on feet     Problem List There are no problems to display for this  patient.  Lady Deutscher PT, DPT [phone] Lady Deutscher 03/02/2020, 9:07 AM  [place] MAIN [facility] SERVICES [address] [place], [place], [address] Phone: [phone]   Fax:  [phone]  Name: Jamie Trevino MRN: [id number] Date of Birth: Mar 03, 1956

## 2020-03-03 ENCOUNTER — Ambulatory Visit
Admission: RE | Admit: 2020-03-03 | Discharge: 2020-03-03 | Disposition: A | Discharge status: Home / Self Care | Payer: Medicaid Other | Source: Ambulatory Visit | Attending: Neurology | Admitting: Neurology

## 2020-03-03 ENCOUNTER — Other Ambulatory Visit: Payer: Self-pay

## 2020-03-03 DIAGNOSIS — R4189 Other symptoms and signs involving cognitive functions and awareness: Secondary | ICD-10-CM | POA: Insufficient documentation

## 2020-03-03 DIAGNOSIS — R42 Dizziness and giddiness: Secondary | ICD-10-CM | POA: Diagnosis not present

## 2020-03-03 MED ORDER — GADOBUTROL 1 MMOL/ML IV SOLN
8.0000 mL | Freq: Once | INTRAVENOUS | Status: AC | PRN
  Administered 2020-03-03: 19:00:00 8 mL via INTRAVENOUS

## 2020-03-04 DIAGNOSIS — E119 Type 2 diabetes mellitus without complications: Secondary | ICD-10-CM | POA: Insufficient documentation

## 2020-03-12 ENCOUNTER — Other Ambulatory Visit: Payer: Self-pay

## 2020-03-12 ENCOUNTER — Ambulatory Visit: Payer: Medicaid Other | Attending: Neurology

## 2020-03-12 DIAGNOSIS — R2681 Unsteadiness on feet: Secondary | ICD-10-CM | POA: Diagnosis present

## 2020-03-12 DIAGNOSIS — R42 Dizziness and giddiness: Secondary | ICD-10-CM | POA: Diagnosis present

## 2020-03-12 NOTE — Therapy (Signed)
[place] MAIN [facility] SERVICES [address] [place], [place], [address] Phone: [phone]   Fax:  [phone]  Physical Therapy Treatment  Patient Details  Name: Jamie Trevino MRN: [id number] Date of Birth: 20-Dec-1955 Referring Provider (PT): Dr. Manuella Ghazi   Encounter Date: 03/12/2020   PT End of Session - 03/12/20 0812    Visit Number 2    Number of Visits 9    Date for PT Re-Evaluation 04/27/20    PT Start Time 0810    PT Stop Time 0850    PT Time Calculation (min) 40 min    Activity Tolerance Patient tolerated treatment well    Behavior During Therapy [facility] for tasks assessed/performed           Past Medical History:  Diagnosis Date  . Hypertension     Past Surgical History:  Procedure Laterality Date  . ABDOMINAL HYSTERECTOMY    . adenomatous colon polyps  09/14/2016  . COLONOSCOPY N/A 12/22/2016   Procedure: COLONOSCOPY;  Surgeon: Manya Silvas, MD;  Location: [facility] ENDOSCOPY;  Service: Endoscopy;  Laterality: N/A;  . TUBAL LIGATION      There were no vitals filed for this visit.   Subjective Assessment - 03/12/20 0810    Subjective Patient states she now only gets dizzy depending on the movement/position. Specifically whens he rolls over in bed. It has gotten better since last visit. Laying down on the L side, looking for something up towards ceiling, or looking down towards ground quickly provokes the dizziness. She notes that the episodes are less frequent, shorter duration, but more severe. She denies any pain.    Patient is accompained by: Interpreter    Pertinent History Patient reports that she continues with the issue in her head and "I have the dizziness". Patient reports bending down, lying down, rolling over and look up causes dizziness and heaviness in her head. Patient reports if she is trying to get up after lying down she feels she is losing her balance. Patient reports vertigo that lasts a short time. Patient  reports that in the beginning she would sleep sitting up because the dizziness was very strong. Patient reports that now she feels she is losing her balance. Patient reports that she veers when she walks. Patient reports at first she went to the ENT doctor and was getting treatment for the dizziness, but states it keeps coming back. They told her it was not coming from the inner ear and she was referred to the neurologist. Patient reports she is getting episodes of dizziness and also states the dizziness is always there and needs to take pills "so that is why they are looking at my head". Patient reports that now the problem is more when she bends down to try to pick up something. Patient reports she has been laid off work but is scheduled to start back next week. 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 Patient reports that she used to have difficulty with cleaning the mirrors as it would cause dizziness when she would look up. Patient reports she feels pressure in the top back of her head. Scheduled for a brain MRI tomorrow.    Diagnostic tests VNG-normal per MR    Patient Stated Goals to have decreased dizziness    Currently in Pain? No/denies           TREATMENT  Canalith Repositioning  Maneuver: On inverted mat table, performed right Dix-Hallpike test. Negative for both vertigo and nystagmus. Then left Dix-Hallpike test was performed and was positive with left upbeating torsional nystagmus of short duration without latency and moderate to serve vertigo. Performed left canalith repositioning maneuver (CRT). Two minute holds in each position. Upon sitting, there was some reversal of nystagmus and patient has severe vertigo. Repeated left CRT for a total of 3 maneuvers with retesting between maneuvers. During retesting after first maneuver pt is negative for vertigo or nystagmus however in final seated position of Epley  pt again experiences severe vertigo with reversal of nystagmus. During third retesting pt is negative for both vertigo and nystagmus and no vertigo/nystagmus during final return to sitting. Patient was given HEP and educated on how to do CRT maneuver at home in both [place] and [place].             PT Short Term Goals - 03/02/20 0901      PT SHORT TERM GOAL #1   Title Patient will be able to perform home program independently for self-management and for improved function at home and work.    Time 4    Period Weeks    Status New    Target Date 03/30/20             PT Long Term Goals - 03/04/20 1451      PT LONG TERM GOAL #1   Title Patient will reduce falls risk as indicated by Activities Specific Balance Confidence Scale (ABC) 80% or greater.    Baseline scored 71.3 on 03/02/20    Time 8    Period Weeks    Status New    Target Date 04/27/20      PT LONG TERM GOAL #2   Title Patient will have improved FOTO score of 5 points or more to indicate improved ability to perform ADLs and walking in her daily life.    Baseline scored 65/100 on 03/02/2020    Time 8    Period Weeks    Status New    Target Date 04/27/20      PT LONG TERM GOAL #3   Title Patient will report 50% or greater improvement in her symptoms of dizziness and imbalance with provoking motions or positions.    Time 8    Period Weeks    Status New    Target Date 04/27/20                 Plan - 03/12/20 0816    Clinical Impression Statement On inverted mat table, performed right Dix-Hallpike test. Negative for both vertigo and nystagmus. Then left Dix-Hallpike test was performed and was positive with left upbeating torsional nystagmus of short duration without latency and moderate to serve vertigo. Performed left canalith repositioning maneuver (CRT). Two minute holds in each position. Upon sitting, there was some reversal of nystagmus and patient has severe vertigo. Repeated left CRT for a total of 3  maneuvers with retesting between maneuvers. During retesting after first maneuver pt is negative for vertigo or nystagmus however in final seated position of Epley pt again experiences severe vertigo with reversal of nystagmus. During third retesting pt is negative for both vertigo and nystagmus and no vertigo/nystagmus during final return to sitting. Patient was given HEP and educated on how to do CRT maneuver at home in both [place] and [place].    Personal Factors and Comorbidities Past/Current Experience;Comorbidity 1;Time since onset of injury/illness/exacerbation    Comorbidities  HTN    Examination-Activity Limitations Bend;Reach Overhead    Examination-Participation Restrictions Cleaning    Stability/Clinical Decision Making Evolving/Moderate complexity    Rehab Potential Fair    PT Frequency 1x / week    PT Duration 8 weeks    PT Treatment/Interventions Canalith Repostioning;Gait training;Stair training;Therapeutic activities;Therapeutic exercise;Balance training;Neuromuscular re-education;Patient/family education;Vestibular    PT Next Visit Plan canal testing until clearance of particles is obtained; then complete vestibulo-ocular exam    Consulted and Agree with Plan of Care Patient           Patient will benefit from skilled therapeutic intervention in order to improve the following deficits and impairments:  Dizziness, Decreased balance, Difficulty walking  Visit Diagnosis: Dizziness and giddiness  Unsteadiness on feet     Problem List There are no problems to display for this patient.    Noemi Chapel, SPT  This entire session was performed under direct supervision and direction of a licensed therapist/therapist assistant . I have personally read, edited and approve of the note as written.   Phillips Grout PT, DPT, GCS  Audrena Talaga 03/12/2020, 9:01 AM  [place] MAIN [facility] SERVICES [address] [place], [place],  [address] Phone: [phone]   Fax:  [phone]  Name: Tyarra Nolton MRN: [id number] Date of Birth: 08-05-1956

## 2020-03-16 ENCOUNTER — Encounter: Payer: Self-pay | Admitting: Physical Therapy

## 2020-03-16 ENCOUNTER — Other Ambulatory Visit: Payer: Self-pay

## 2020-03-16 ENCOUNTER — Ambulatory Visit: Payer: Medicaid Other | Admitting: Physical Therapy

## 2020-03-16 DIAGNOSIS — R42 Dizziness and giddiness: Secondary | ICD-10-CM | POA: Diagnosis not present

## 2020-03-16 DIAGNOSIS — R2681 Unsteadiness on feet: Secondary | ICD-10-CM

## 2020-03-16 NOTE — Therapy (Signed)
=[place] MAIN [facility] SERVICES [address] [place], [place], [address] Phone: [phone]   Fax:  [phone]  Physical Therapy Treatment  Patient Details  Name: Jamie Trevino MRN: [id number] Date of Birth: 06/29/1956 Referring Provider (PT): Dr. Manuella Ghazi   Encounter Date: 03/16/2020   PT End of Session - 03/16/20 0809    Visit Number 3    Number of Visits 9    Date for PT Re-Evaluation 04/27/20    PT Start Time 0809    PT Stop Time 0845    PT Time Calculation (min) 36 min    Activity Tolerance Patient tolerated treatment well    Behavior During Therapy [facility] for tasks assessed/performed           Past Medical History:  Diagnosis Date  . Hypertension     Past Surgical History:  Procedure Laterality Date  . ABDOMINAL HYSTERECTOMY    . adenomatous colon polyps  09/14/2016  . COLONOSCOPY N/A 12/22/2016   Procedure: COLONOSCOPY;  Surgeon: Manya Silvas, MD;  Location: [facility] ENDOSCOPY;  Service: Endoscopy;  Laterality: N/A;  . TUBAL LIGATION      There were no vitals filed for this visit.   Subjective Assessment - 03/16/20 0809    Subjective Patient states she has done really bad this past week. Patient reports she has had more dizziness since last Friday. Patient reports she was able to do the self-Epley with her daughters help. Patient states when she left here Friday she could hardly walk and she had to take medication. Patient reports that the self-Epley recreated her dizziness symptoms. Patient reports no questions or concerns in regards to self-Epley maneuver.    Patient is accompained by: Interpreter    Pertinent History Patient reports that she continues with the issue in her head and "I have the dizziness". Patient reports bending down, lying down, rolling over and look up causes dizziness and heaviness in her head. Patient reports if she is trying to get up after lying down she feels she is losing her balance. Patient reports  vertigo that lasts a short time. Patient reports that in the beginning she would sleep sitting up because the dizziness was very strong. Patient reports that now she feels she is losing her balance. Patient reports that she veers when she walks. Patient reports at first she went to the ENT doctor and was getting treatment for the dizziness, but states it keeps coming back. They told her it was not coming from the inner ear and she was referred to the neurologist. Patient reports she is getting episodes of dizziness and also states the dizziness is always there and needs to take pills "so that is why they are looking at my head". Patient reports that now the problem is more when she bends down to try to pick up something. Patient reports she has been laid off work but is scheduled to start back next week. 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 Patient reports that she used to have difficulty with cleaning the mirrors as it would cause dizziness when she would look up. Patient reports she feels pressure in the top back of her head. Scheduled for a brain MRI tomorrow.    Diagnostic tests VNG-normal per MR    Patient Stated Goals to have decreased dizziness            Canalith Repositioning Maneuver:  On mat table, performed left Dix-Hallpike testing and it was positive with about 3 beats of left upbeating torsional nystagmus observed without latency. Patient rated 3/10 vertigo. Performed left canalith repositioning maneuver (CRT) with one minute holds in each position and performed a total of 3 maneuvers. Patient without vertigo and no nystagmus observed with last two maneuvers but performed to try to ensure clearance of particles. Patient reports that she feels pretty good after the maneuvers and patient did not have any reversal of nystagmus and no dizziness or vertigo reported during the maneuvers when going from right  side-lying to sitting up position this date.   Patient with positive left Dix-Hallpike test without latency with about 3 beats of left upbeating torsional nystagmus observed and patient reported 3/10 vertigo. Performed left Epley maneuver with one minute holds in each position and performed a total of 3 maneuvers. Patient without vertigo and no nystagmus observed with last two maneuvers but performed to try to ensure clearance of particles. Patient encouraged to continue to perform self-Epley maneuver with her daughter's assistance until next seen in clinic and then will plan on retesting on inverted mat table     PT Education - 03/16/20 0809    Education Details discussed BPPV and Epley maneuver as well as plan for next session    Person(s) Educated Patient    Methods Explanation    Comprehension Verbalized understanding            PT Short Term Goals - 03/16/20 0855      PT SHORT TERM GOAL #1   Title Patient will be able to perform home program independently for self-management and for improved function at home and work.    Time 4    Period Weeks    Status Achieved    Target Date 03/30/20             PT Long Term Goals - 03/04/20 1451      PT LONG TERM GOAL #1   Title Patient will reduce falls risk as indicated by Activities Specific Balance Confidence Scale (ABC) 80% or greater.    Baseline scored 71.3 on 03/02/20    Time 8    Period Weeks    Status New    Target Date 04/27/20      PT LONG TERM GOAL #2   Title Patient will have improved FOTO score of 5 points or more to indicate improved ability to perform ADLs and walking in her daily life.    Baseline scored 65/100 on 03/02/2020    Time 8    Period Weeks    Status New    Target Date 04/27/20      PT LONG TERM GOAL #3   Title Patient will report 50% or greater improvement in her symptoms of dizziness and imbalance with provoking motions or positions.    Time 8    Period Weeks    Status New    Target Date  04/27/20                 Plan - 03/16/20 0852    Clinical Impression Statement Patient with positive left Dix-Hallpike test without latency with about 3 beats of left upbeating torsional nystagmus observed and patient reported 3/10 vertigo. Performed left Epley maneuver with one minute holds in each position and performed a total of 3 maneuvers. Patient without vertigo and no nystagmus observed with last two maneuvers but performed to try to ensure clearance of particles. Patient encouraged to continue  to perform self-Epley maneuver with her daughter's assistance until next seen in clinic and then will plan on retesting on inverted mat table.    Personal Factors and Comorbidities Past/Current Experience;Comorbidity 1;Time since onset of injury/illness/exacerbation    Comorbidities HTN    Examination-Activity Limitations Bend;Reach Overhead    Examination-Participation Restrictions Cleaning    Stability/Clinical Decision Making Evolving/Moderate complexity    Rehab Potential Fair    PT Frequency 1x / week    PT Duration 8 weeks    PT Treatment/Interventions Canalith Repostioning;Gait training;Stair training;Therapeutic activities;Therapeutic exercise;Balance training;Neuromuscular re-education;Patient/family education;Vestibular    PT Next Visit Plan canal testing until clearance of particles is obtained; then complete vestibulo-ocular exam    Consulted and Agree with Plan of Care Patient           Patient will benefit from skilled therapeutic intervention in order to improve the following deficits and impairments:  Dizziness, Decreased balance, Difficulty walking  Visit Diagnosis: Dizziness and giddiness  Unsteadiness on feet     Problem List There are no problems to display for this patient.  Lady Deutscher PT, DPT [phone] Lady Deutscher 03/16/2020, 8:58 AM  [place] MAIN [facility] SERVICES [address] [place], [place],  [address] Phone: [phone]   Fax:  [phone]  Name: Esmae Donathan MRN: [id number] Date of Birth: Aug 04, 1956

## 2020-03-23 ENCOUNTER — Ambulatory Visit: Payer: Medicaid Other | Admitting: Physical Therapy

## 2020-03-23 ENCOUNTER — Other Ambulatory Visit: Payer: Self-pay

## 2020-03-23 ENCOUNTER — Encounter: Payer: Self-pay | Admitting: Physical Therapy

## 2020-03-23 DIAGNOSIS — R42 Dizziness and giddiness: Secondary | ICD-10-CM | POA: Diagnosis not present

## 2020-03-23 DIAGNOSIS — R2681 Unsteadiness on feet: Secondary | ICD-10-CM

## 2020-03-23 NOTE — Therapy (Signed)
[place] MAIN [facility] SERVICES [address] [place], [place], [address] Phone: [phone]   Fax:  [phone]  Physical Therapy Treatment  Patient Details  Name: Jamie Trevino MRN: [id number] Date of Birth: 10-Aug-1956 Referring Provider (PT): Dr. Manuella Ghazi   Encounter Date: 03/23/2020   PT End of Session - 03/23/20 0808    Visit Number 4    Number of Visits 9    Date for PT Re-Evaluation 04/27/20    PT Start Time 0809    PT Stop Time 0845    PT Time Calculation (min) 36 min    Activity Tolerance Patient tolerated treatment well    Behavior During Therapy [facility] for tasks assessed/performed           Past Medical History:  Diagnosis Date   Hypertension     Past Surgical History:  Procedure Laterality Date   ABDOMINAL HYSTERECTOMY     adenomatous colon polyps  09/14/2016   COLONOSCOPY N/A 12/22/2016   Procedure: COLONOSCOPY;  Surgeon: Manya Silvas, MD;  Location: [facility] ENDOSCOPY;  Service: Endoscopy;  Laterality: N/A;   TUBAL LIGATION      There were no vitals filed for this visit.   Subjective Assessment - 03/23/20 0811    Subjective Patient states that she had one episode of vertigo this past week. Patient reports she is feeling okay.    Patient is accompained by: Interpreter    Pertinent History Patient reports that she continues with the issue in her head and "I have the dizziness". Patient reports bending down, lying down, rolling over and look up causes dizziness and heaviness in her head. Patient reports if she is trying to get up after lying down she feels she is losing her balance. Patient reports vertigo that lasts a short time. Patient reports that in the beginning she would sleep sitting up because the dizziness was very strong. Patient reports that now she feels she is losing her balance. Patient reports that she veers when she walks. Patient reports at first she went to the ENT doctor and was getting treatment for  the dizziness, but states it keeps coming back. They told her it was not coming from the inner ear and she was referred to the neurologist. Patient reports she is getting episodes of dizziness and also states the dizziness is always there and needs to take pills "so that is why they are looking at my head". Patient reports that now the problem is more when she bends down to try to pick up something. Patient reports she has been laid off work but is scheduled to start back next week. 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 Patient reports that she used to have difficulty with cleaning the mirrors as it would cause dizziness when she would look up. Patient reports she feels pressure in the top back of her head. Scheduled for a brain MRI tomorrow.    Diagnostic tests VNG-normal per MR    Patient Stated Goals to have decreased dizziness           Canalith Repositioning Maneuver: On inverted mat table performed left Dix-Hallpike testing and it was potentially positive with no nystagmus observed, but patient reported brief about 5 seconds or less of vertigo rated as 2/10 in severity. Patient also reported a brief few seconds of vertigo when going from head turn to the left position to head  turn to the right position.  Performed left canalith repositioning maneuver (CRT). Repeated L CRT for a total of 2 maneuvers. Patient denied vertigo and no nystagmus was observed with second Epley but performed to try to ensure clearance of particles.   On inverted mat table performed right Dix-Hallpike testing and it was negative with no nystagmus and patient denied dizziness or vertigo.  After CRT, performed neuromuscular re-education:  VOR X 1 exercise:  Demonstrated and educated as to VOR X1.  Patient performed VOR X 1 horizontal in sitting 1 rep of 30 seconds and 2 reps of 1 minute each with verbal cues for technique.   Issued VOR  X 1 in sitting for HEP.    Patient Instructions:   Access Code: [email or web address] URL: [email or web address] Date: 03/23/2020 Prepared by: Lady Deutscher  Exercises Seated VOR X1 exercise - 3 x daily - 7 x weekly - 1 sets - 3 reps - 60 hold   PT Education - 03/23/20 0807    Education Details patient educated as to VOR X 1 in sitting 1 minute reps; handout from [place] in [place] provided   Person(s) Educated Patient    Methods Explanation, demonstrated, verbal cues, handout   Comprehension Verbalized understanding , returned demonstration           PT Short Term Goals - 03/16/20 0855      PT SHORT TERM GOAL #1   Title Patient will be able to perform home program independently for self-management and for improved function at home and work.    Time 4    Period Weeks    Status Achieved    Target Date 03/30/20             PT Long Term Goals - 03/04/20 1451      PT LONG TERM GOAL #1   Title Patient will reduce falls risk as indicated by Activities Specific Balance Confidence Scale (ABC) 80% or greater.    Baseline scored 71.3 on 03/02/20    Time 8    Period Weeks    Status New    Target Date 04/27/20      PT LONG TERM GOAL #2   Title Patient will have improved FOTO score of 5 points or more to indicate improved ability to perform ADLs and walking in her daily life.    Baseline scored 65/100 on 03/02/2020    Time 8    Period Weeks    Status New    Target Date 04/27/20      PT LONG TERM GOAL #3   Title Patient will report 50% or greater improvement in her symptoms of dizziness and imbalance with provoking motions or positions.    Time 8    Period Weeks    Status New    Target Date 04/27/20                 Plan - 03/25/20 5852    Clinical Impression Statement Patient reports continued episodes of vertigo and dizziness. Patient with 2/10 dizziness with Epley maneuver and 0/10 with subsequent manuever. Patient with negative right BPPV test. Patient  issued VOR X 1 horizontal in sitting for HEP.  Patient continues to have dizziness and would benefit from continued PT services to further address deficits and goals as set on plan of care.    Personal Factors and Comorbidities Past/Current Experience;Comorbidity 1;Time since onset of injury/illness/exacerbation    Comorbidities HTN    Examination-Activity Limitations Bend;Reach Overhead    Examination-Participation Restrictions Cleaning  Stability/Clinical Decision Making Evolving/Moderate complexity    Rehab Potential Fair    PT Frequency 1x / week    PT Duration 8 weeks    PT Treatment/Interventions Canalith Repostioning;Gait training;Stair training;Therapeutic activities;Therapeutic exercise;Balance training;Neuromuscular re-education;Patient/family education;Vestibular    PT Next Visit Plan canal testing until clearance of particles is obtained; then complete vestibulo-ocular exam    PT Home Exercise Plan Access Code: [email or web address]: [email or web address]: 07/20/2021Prepared by: Melchor Amour MurphyExercisesSeated VOR X1 exercise - 3 x daily - 7 x weekly - 1 sets - 3 reps - 60 hold; (VOR X 1 in sitting)    Consulted and Agree with Plan of Care Patient           Patient will benefit from skilled therapeutic intervention in order to improve the following deficits and impairments:  Dizziness, Decreased balance, Difficulty walking  Visit Diagnosis: Dizziness and giddiness  Unsteadiness on feet     Problem List There are no problems to display for this patient.  Lady Deutscher PT, DPT [phone] Lady Deutscher 03/23/2020, 10:12 AM  [place] MAIN [facility] [address] [place], [place], [address] Phone: [phone]   Fax:  [phone]  Name: Jamie Trevino MRN: [id number] Date of Birth: [birth year]

## 2020-03-23 NOTE — Patient Instructions (Signed)
Access Code: [email or web address] URL: [email or web address] Date: 03/23/2020 Prepared by: Lady Deutscher  Exercises Seated VOR X1 exercise - 3 x daily - 7 x weekly - 1 sets - 3 reps - 60 hold

## 2020-04-02 ENCOUNTER — Ambulatory Visit: Payer: Medicaid Other

## 2020-04-09 ENCOUNTER — Other Ambulatory Visit: Payer: Self-pay

## 2020-04-09 ENCOUNTER — Ambulatory Visit: Payer: Medicaid Other | Attending: Neurology

## 2020-04-09 DIAGNOSIS — R2681 Unsteadiness on feet: Secondary | ICD-10-CM | POA: Diagnosis present

## 2020-04-09 DIAGNOSIS — R42 Dizziness and giddiness: Secondary | ICD-10-CM | POA: Insufficient documentation

## 2020-04-09 NOTE — Therapy (Signed)
[place] MAIN [facility] SERVICES [address] [place], [place], [address] Phone: [phone]   Fax:  [phone]  Physical Therapy Treatment  Patient Details  Name: Columbia Pandey MRN: [id number] Date of Birth: 1956-01-11 Referring Provider (PT): Dr. Manuella Ghazi   Encounter Date: 04/09/2020   PT End of Session - 04/09/20 0858    Visit Number 5    Number of Visits 9    Date for PT Re-Evaluation 04/27/20    PT Start Time 0811    PT Stop Time 0845    PT Time Calculation (min) 34 min    Activity Tolerance Patient tolerated treatment well    Behavior During Therapy [facility] for tasks assessed/performed           Past Medical History:  Diagnosis Date  . Hypertension     Past Surgical History:  Procedure Laterality Date  . ABDOMINAL HYSTERECTOMY    . adenomatous colon polyps  09/14/2016  . COLONOSCOPY N/A 12/22/2016   Procedure: COLONOSCOPY;  Surgeon: Manya Silvas, MD;  Location: [facility] ENDOSCOPY;  Service: Endoscopy;  Laterality: N/A;  . TUBAL LIGATION      There were no vitals filed for this visit.   Subjective Assessment - 04/09/20 0855    Subjective Pt reports that her vertigo has been really bad since her last appointment. She continues to have symptoms when rolling to the left side. She has been performing her HEP and notices increase in her dizziness with the exercise but she does feel like it is helping. No specific questions or concerns at this time.    Patient is accompained by: Interpreter    Pertinent History Patient reports that she continues with the issue in her head and "I have the dizziness". Patient reports bending down, lying down, rolling over and look up causes dizziness and heaviness in her head. Patient reports if she is trying to get up after lying down she feels she is losing her balance. Patient reports vertigo that lasts a short time. Patient reports that in the beginning she would sleep sitting up because the dizziness  was very strong. Patient reports that now she feels she is losing her balance. Patient reports that she veers when she walks. Patient reports at first she went to the ENT doctor and was getting treatment for the dizziness, but states it keeps coming back. They told her it was not coming from the inner ear and she was referred to the neurologist. Patient reports she is getting episodes of dizziness and also states the dizziness is always there and needs to take pills "so that is why they are looking at my head". Patient reports that now the problem is more when she bends down to try to pick up something. Patient reports she has been laid off work but is scheduled to start back next week. 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 Patient reports that she used to have difficulty with cleaning the mirrors as it would cause dizziness when she would look up. Patient reports she feels pressure in the top back of her head. Scheduled for a brain MRI tomorrow.    Diagnostic tests VNG-normal per MR    Patient Stated Goals to have decreased dizziness    Currently in Pain? No/denies              TREATMENT    Canalith Repositioning Maneuver: On inverted  mat table, performed right Dix-Hallpike test. Negative for both vertigo and nystagmus. Left Dix-Hallpike test was performed and was positive with 8-10 mild beats of left upbeating torsional nystagmus of short duration with 5-6s latency and mild vertigo rated as "1-2/10." Performed left canalith repositioning maneuver (CRT). One minute holds in each position. After first maneuver performed one deep head hang maneuver with 1 minute holds in both positions. Upon sitting, pt reports some vertigo but no nystagmus observed. Repeated Dix-Hallpike test on the the left and pt has much more vigorous upbeating L torsional nystagmus which lasts for 10-15s. Performed second Epley maneuver with one  minute holds Upon returning to sitting she has reversal of nystagmus and patient has moderate/severe vertigo of short duration. Repeated L Dix-Hallpike test which is now negative for but completed one final Epley maneuver with one minute holds in each position.    Reinforced continuation of HEP.                                PT Short Term Goals - 03/16/20 0855      PT SHORT TERM GOAL #1   Title Patient will be able to perform home program independently for self-management and for improved function at home and work.    Time 4    Period Weeks    Status Achieved    Target Date 03/30/20             PT Long Term Goals - 03/04/20 1451      PT LONG TERM GOAL #1   Title Patient will reduce falls risk as indicated by Activities Specific Balance Confidence Scale (ABC) 80% or greater.    Baseline scored 71.3 on 03/02/20    Time 8    Period Weeks    Status New    Target Date 04/27/20      PT LONG TERM GOAL #2   Title Patient will have improved FOTO score of 5 points or more to indicate improved ability to perform ADLs and walking in her daily life.    Baseline scored 65/100 on 03/02/2020    Time 8    Period Weeks    Status New    Target Date 04/27/20      PT LONG TERM GOAL #3   Title Patient will report 50% or greater improvement in her symptoms of dizziness and imbalance with provoking motions or positions.    Time 8    Period Weeks    Status New    Target Date 04/27/20                 Plan - 04/09/20 0858    Clinical Impression Statement On inverted mat table, performed right Dix-Hallpike test. Negative for both vertigo and nystagmus. Left Dix-Hallpike test was performed and was positive with 8-10 mild beats of left upbeating torsional nystagmus of short duration with 5-6s latency and mild vertigo rated as "1-2/10." Performed left canalith repositioning maneuver (CRT). One minute holds in each position. After first maneuver performed one deep head  hang maneuver with 1 minute holds in both positions. Upon sitting, pt reports some vertigo but no nystagmus observed. Repeated Dix-Hallpike test on the the left and pt has much more vigorous upbeating L torsional nystagmus which lasts for 10-15s. Performed second Epley maneuver with one minute holds Upon returning to sitting she has reversal of nystagmus and patient has moderate/severe vertigo of short duration. Repeated L Dix-Hallpike  test which is now negative for but completed one final Epley maneuver with one minute holds in each position. Reinforced the importance of continuation of HEP and plan of care. Pt will continue to benefit from skilled PT services to address deficits in vertigo/dizziness in order to improve symptom-free function at home.    Personal Factors and Comorbidities Past/Current Experience;Comorbidity 1;Time since onset of injury/illness/exacerbation    Comorbidities HTN    Examination-Activity Limitations Bend;Reach Overhead    Examination-Participation Restrictions Cleaning    Stability/Clinical Decision Making Evolving/Moderate complexity    Rehab Potential Fair    PT Frequency 1x / week    PT Duration 8 weeks    PT Treatment/Interventions Canalith Repostioning;Gait training;Stair training;Therapeutic activities;Therapeutic exercise;Balance training;Neuromuscular re-education;Patient/family education;Vestibular    PT Next Visit Plan canal testing until clearance of particles is obtained; then complete vestibulo-ocular exam    PT Home Exercise Plan Access Code: [email or web address]: [email or web address]: 07/20/2021Prepared by: Melchor Amour MurphyExercisesSeated VOR X1 exercise - 3 x daily - 7 x weekly - 1 sets - 3 reps - 60 hold; (VOR X 1 in sitting)    Consulted and Agree with Plan of Care Patient           Patient will benefit from skilled therapeutic intervention in order to improve the following deficits and impairments:  Dizziness, Decreased balance, Difficulty  walking  Visit Diagnosis: Dizziness and giddiness  Unsteadiness on feet     Problem List There are no problems to display for this patient.  Phillips Grout PT, DPT, GCS  Mianna Iezzi 04/09/2020, 9:35 AM  [place] MAIN [facility] SERVICES [address] [place], [place], [address] Phone: [phone]   Fax:  [phone]  Name: Krysteena Stalker MRN: [id number] Date of Birth: [birth year]

## 2020-04-16 ENCOUNTER — Ambulatory Visit: Payer: Medicaid Other

## 2020-04-16 ENCOUNTER — Other Ambulatory Visit: Payer: Self-pay

## 2020-04-16 DIAGNOSIS — R42 Dizziness and giddiness: Secondary | ICD-10-CM

## 2020-04-16 NOTE — Therapy (Signed)
[place] MAIN [facility] SERVICES [address] [place], [place], [address] Phone: [phone]   Fax:  [phone]  Physical Therapy Treatment  Patient Details  Name: Jamie Trevino MRN: [id number] Date of Birth: 06-08-1956 Referring Provider (PT): Dr. Manuella Ghazi   Encounter Date: 04/16/2020   PT End of Session - 04/16/20 1037    Visit Number 6    Number of Visits 9    Date for PT Re-Evaluation 04/27/20    PT Start Time 0815    PT Stop Time 0900    PT Time Calculation (min) 45 min    Activity Tolerance Patient tolerated treatment well    Behavior During Therapy [facility] for tasks assessed/performed           Past Medical History:  Diagnosis Date  . Hypertension     Past Surgical History:  Procedure Laterality Date  . ABDOMINAL HYSTERECTOMY    . adenomatous colon polyps  09/14/2016  . COLONOSCOPY N/A 12/22/2016   Procedure: COLONOSCOPY;  Surgeon: Manya Silvas, MD;  Location: [facility] ENDOSCOPY;  Service: Endoscopy;  Laterality: N/A;  . TUBAL LIGATION      There were no vitals filed for this visit.   Subjective Assessment - 04/16/20 0815    Subjective Pt reports that her vertigo has been much better since her last therapy session. She has had two episodes of vertigo when rolling to the left side but otherwise no symptoms. No specific questions or concerns at this time.    Patient is accompained by: Interpreter    Pertinent History Patient reports that she continues with the issue in her head and "I have the dizziness". Patient reports bending down, lying down, rolling over and look up causes dizziness and heaviness in her head. Patient reports if she is trying to get up after lying down she feels she is losing her balance. Patient reports vertigo that lasts a short time. Patient reports that in the beginning she would sleep sitting up because the dizziness was very strong. Patient reports that now she feels she is losing her balance. Patient  reports that she veers when she walks. Patient reports at first she went to the ENT doctor and was getting treatment for the dizziness, but states it keeps coming back. They told her it was not coming from the inner ear and she was referred to the neurologist. Patient reports she is getting episodes of dizziness and also states the dizziness is always there and needs to take pills "so that is why they are looking at my head". Patient reports that now the problem is more when she bends down to try to pick up something. Patient reports she has been laid off work but is scheduled to start back next week. 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 Patient reports that she used to have difficulty with cleaning the mirrors as it would cause dizziness when she would look up. Patient reports she feels pressure in the top back of her head. Scheduled for a brain MRI tomorrow.    Diagnostic tests VNG-normal per MR    Patient Stated Goals to have decreased dizziness    Currently in Pain? No/denies                TREATMENT    Canalith Repositioning Maneuver: On inverted mat table, performed left Left Dix-Hallpike test which was positive with 5-6 faint beats  of left upbeating torsional nystagmus of short duration with 1-2s latency and mild vertigo. Performed left canalith repositioning maneuver (CRT). One minute holds in each position. After first maneuver repeated Dix-Hallpike test on the the left and pt has much more vigorous upbeating L torsional nystagmus which lasts for 10-15s. Much more severe vertigo. Performed second Epley maneuver with one minute holds. Performed R sidelying test which is positive for >1 minute of upbeating R torsional nystagmus. Nystagmus starts slow but increases in amplitude with pt reporting significant vertigo. L sidelying test performed which is positive for upbeating L torsional nystagmus which lasts  for approximately 15-20s. Pt reports symptoms are more significant on the left. Performed Liberatory maneuver for L side with one minute holds in each position. Repeated L sidelying test which is negative for vertigo or nystagmus. Repeated R sidelying test which is positive for >1 minute of upbeating R torsional nystagmus which concurrent vertigo. Performed one Liberatory maneuver for the R side with one minute holds followed by repeated R sidelying test which remains positive for appropriate nystagmus and vertigo. Performed second Liberatory maneuver for R side. Final retesting is still faintly positive in the R sidelying position but much improved. At this time it appears that pt has both L posterior canal canalithiasis as well as R posterior cupulolithiasis. Will retest at next visit and treat the more symptomatic side. Interestingly R Epley has always been negative during previous sessions. Pt may need to utilize sidelying test for R side.                                     PT Short Term Goals - 03/16/20 0855      PT SHORT TERM GOAL #1   Title Patient will be able to perform home program independently for self-management and for improved function at home and work.    Time 4    Period Weeks    Status Achieved    Target Date 03/30/20             PT Long Term Goals - 03/04/20 1451      PT LONG TERM GOAL #1   Title Patient will reduce falls risk as indicated by Activities Specific Balance Confidence Scale (ABC) 80% or greater.    Baseline scored 71.3 on 03/02/20    Time 8    Period Weeks    Status New    Target Date 04/27/20      PT LONG TERM GOAL #2   Title Patient will have improved FOTO score of 5 points or more to indicate improved ability to perform ADLs and walking in her daily life.    Baseline scored 65/100 on 03/02/2020    Time 8    Period Weeks    Status New    Target Date 04/27/20      PT LONG TERM GOAL #3   Title Patient will report 50% or  greater improvement in her symptoms of dizziness and imbalance with provoking motions or positions.    Time 8    Period Weeks    Status New    Target Date 04/27/20                 Plan - 04/16/20 1037    Clinical Impression Statement On inverted mat table, performed left Left Dix-Hallpike test which was positive with 5-6 faint beats of left upbeating torsional nystagmus of short duration with  1-2s latency and mild vertigo. Performed left canalith repositioning maneuver (CRT). One minute holds in each position. After first maneuver repeated Dix-Hallpike test on the the left and pt has much more vigorous upbeating L torsional nystagmus which lasts for 10-15s. Much more severe vertigo. Performed second Epley maneuver with one minute holds. Performed R sidelying test which is positive for >1 minute of upbeating R torsional nystagmus. Nystagmus starts slow but increases in amplitude with pt reporting significant vertigo. L sidelying test performed which is positive for upbeating L torsional nystagmus which lasts for approximately 15-20s. Pt reports symptoms are more significant on the left. Performed Liberatory maneuver for L side with one minute holds in each position. Repeated L sidelying test which is negative for vertigo or nystagmus. Repeated R sidelying test which is positive for >1 minute of upbeating R torsional nystagmus which concurrent vertigo. Performed one Liberatory maneuver for the R side with one minute holds followed by repeated R sidelying test which remains positive for appropriate nystagmus and vertigo. Performed second Liberatory maneuver for R side. Final retesting is still faintly positive in the R sidelying position but much improved. At this time it appears that pt has both L posterior canal canalithiasis as well as R posterior cupulolithiasis. Will retest at next visit and treat the more symptomatic side. Interestingly R Epley has always been negative during previous sessions. Pt  may need to utilize sidelying test for R side.    Personal Factors and Comorbidities Past/Current Experience;Comorbidity 1;Time since onset of injury/illness/exacerbation    Comorbidities HTN    Examination-Activity Limitations Bend;Reach Overhead    Examination-Participation Restrictions Cleaning    Stability/Clinical Decision Making Evolving/Moderate complexity    Rehab Potential Fair    PT Frequency 1x / week    PT Duration 8 weeks    PT Treatment/Interventions Canalith Repostioning;Gait training;Stair training;Therapeutic activities;Therapeutic exercise;Balance training;Neuromuscular re-education;Patient/family education;Vestibular    PT Next Visit Plan canal testing until clearance of particles is obtained; then complete vestibulo-ocular exam    PT Home Exercise Plan Access Code: [email or web address]: [email or web address]: 07/20/2021Prepared by: Melchor Amour MurphyExercisesSeated VOR X1 exercise - 3 x daily - 7 x weekly - 1 sets - 3 reps - 60 hold; (VOR X 1 in sitting)    Consulted and Agree with Plan of Care Patient           Patient will benefit from skilled therapeutic intervention in order to improve the following deficits and impairments:  Dizziness, Decreased balance, Difficulty walking  Visit Diagnosis: Dizziness and giddiness     Problem List There are no problems to display for this patient.  Jamie Trevino PT, DPT, GCS  Jamie Trevino 04/16/2020, 10:47 AM  [place] MAIN [facility] SERVICES [address] [place], [place], [address] Phone: [phone]   Fax:  [phone]  Name: Jamie Trevino MRN: [id number] Date of Birth: Feb 23, 1956

## 2020-04-23 ENCOUNTER — Ambulatory Visit: Payer: Medicaid Other

## 2020-04-23 ENCOUNTER — Other Ambulatory Visit: Payer: Self-pay

## 2020-04-23 DIAGNOSIS — R42 Dizziness and giddiness: Secondary | ICD-10-CM | POA: Diagnosis not present

## 2020-04-23 NOTE — Patient Instructions (Signed)
Access Code: [email or web address] URL: [email or web address] Date: 04/23/2020 Prepared by: Roxana Hires  Exercises Seated VOR X1 exercise - 3 x daily - 7 x weekly - 1 sets - 3 reps - 60 hold Self-Epley Maneuver Left Ear - 1 x daily - 7 x weekly - 3 reps

## 2020-04-23 NOTE — Therapy (Signed)
[place] MAIN [facility] SERVICES [address] [place], [place], [address] Phone: [phone]   Fax:  [phone]  Physical Therapy Treatment/Recertification  Patient Details  Name: Jamie Trevino MRN: [id number] Date of Birth: 05/13/1956 Referring Provider (PT): Dr. Manuella Ghazi   Encounter Date: 04/23/2020   PT End of Session - 04/23/20 0816    Visit Number 7    Number of Visits 13    Date for PT Re-Evaluation 05/21/20    PT Start Time 0815    PT Stop Time 0845    PT Time Calculation (min) 30 min    Activity Tolerance Patient tolerated treatment well    Behavior During Therapy [facility] for tasks assessed/performed           Past Medical History:  Diagnosis Date  . Hypertension     Past Surgical History:  Procedure Laterality Date  . ABDOMINAL HYSTERECTOMY    . adenomatous colon polyps  09/14/2016  . COLONOSCOPY N/A 12/22/2016   Procedure: COLONOSCOPY;  Surgeon: Manya Silvas, MD;  Location: [facility] ENDOSCOPY;  Service: Endoscopy;  Laterality: N/A;  . TUBAL LIGATION      There were no vitals filed for this visit.   Subjective Assessment - 04/23/20 0815    Subjective Pt reports no further episodes of vertigo over the last week. She feels 100% improved since when she started therapy. No specific questions or concerns at this time.    Patient is accompained by: Interpreter    Pertinent History Patient reports that she continues with the issue in her head and "I have the dizziness". Patient reports bending down, lying down, rolling over and look up causes dizziness and heaviness in her head. Patient reports if she is trying to get up after lying down she feels she is losing her balance. Patient reports vertigo that lasts a short time. Patient reports that in the beginning she would sleep sitting up because the dizziness was very strong. Patient reports that now she feels she is losing her balance. Patient reports that she veers when she walks.  Patient reports at first she went to the ENT doctor and was getting treatment for the dizziness, but states it keeps coming back. They told her it was not coming from the inner ear and she was referred to the neurologist. Patient reports she is getting episodes of dizziness and also states the dizziness is always there and needs to take pills "so that is why they are looking at my head". Patient reports that now the problem is more when she bends down to try to pick up something. Patient reports she has been laid off work but is scheduled to start back next week. 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 Patient reports that she used to have difficulty with cleaning the mirrors as it would cause dizziness when she would look up. Patient reports she feels pressure in the top back of her head. Scheduled for a brain MRI tomorrow.    Diagnostic tests VNG-normal per MR    Patient Stated Goals to have decreased dizziness    Currently in Pain? No/denies              TREATMENT   Pt completed FOTO, ABC, and DHI in Spanish with the assistance of interpreter (unbilled);  Canalith Repositioning Maneuver: On inverted mat table, performed right Dix-Hallpike test which was only very faintly positive with  very mild upbeating R torsional nystagmus and very mild vertigo. Pt had a significant latency of approximately 20-25s before nystagmus and symptoms start. L Dix-Hallpike test performed which is negative for both vertigo and nystagmus. Sidelying test performed which is negative on the L side and positive on the R side again for very faint upbeating R torsional nystagmus after significant latency. Pt also reports very mild vertigo. Nystagmus only lasts today approximately 20s. Performed three bouts of the Liberatory Maneuver for R side with one minute holds in each position. Retesting performed between maneuvers and are still positive  during both retets.                             PT Short Term Goals - 04/23/20 0816      PT SHORT TERM GOAL #1   Title Patient will be able to perform home program independently for self-management and for improved function at home and work.    Time 4    Period Weeks    Status Achieved    Target Date --             PT Long Term Goals - 04/23/20 0817      PT LONG TERM GOAL #1   Title Patient will reduce falls risk as indicated by Activities Specific Balance Confidence Scale (ABC) 80% or greater.    Baseline scored 71.3 on 03/02/20; 04/23/20: 99.4%    Time 8    Period Weeks    Status Achieved      PT LONG TERM GOAL #2   Title Patient will have improved FOTO score of 5 points or more to indicate improved ability to perform ADLs and walking in her daily life.    Baseline scored 65/100 on 03/02/2020; 04/23/20: 69    Time 8    Period Weeks    Status Partially Met    Target Date 05/21/20      PT LONG TERM GOAL #3   Title Patient will report 50% or greater improvement in her symptoms of dizziness and imbalance with provoking motions or positions.    Baseline 04/23/20: 100% improvement    Time 8    Period Weeks    Status Achieved                 Plan - 04/23/20 0816    Clinical Impression Statement On inverted mat table, performed right Dix-Hallpike test which was only very faintly positive with very mild upbeating R torsional nystagmus and very mild vertigo. Pt had a significant latency of approximately 20-25s before nystagmus and symptoms start. L Dix-Hallpike test performed which is negative for both vertigo and nystagmus. Sidelying test performed which is negative on the L side and positive on the R side again for very faint upbeating R torsional nystagmus after significant latency. Pt also reports very mild vertigo. Nystagmus only lasts today approximately 20s. Performed three bouts of the Liberatory Maneuver for R side with one minute holds in each  position. Retesting performed between maneuvers and are still positive during both restets. Pt reports 100% resolution of her symptoms today compared to when she started with therapy. Pt completed FOTO, ABC, and [place] in Spanish with the assistance of interpreter. FOTO improved from 65 to 69. ABC improved from 71 to 99%, and DHI improved from 34 to 10. Will retest for BPPV at next visit and treat the R side until vertigo completely resolves.    Personal Factors  and Comorbidities Past/Current Experience;Comorbidity 1;Time since onset of injury/illness/exacerbation    Comorbidities HTN    Examination-Activity Limitations Bend;Reach Overhead    Examination-Participation Restrictions Cleaning    Stability/Clinical Decision Making Evolving/Moderate complexity    Rehab Potential Fair    PT Frequency 1x / week    PT Duration 4 weeks    PT Treatment/Interventions Canalith Repostioning;Gait training;Stair training;Therapeutic activities;Therapeutic exercise;Balance training;Neuromuscular re-education;Patient/family education;Vestibular    PT Next Visit Plan canal testing until clearance of particles is obtained; then complete vestibulo-ocular exam    PT Home Exercise Plan Access Code: [email or web address] (VOR X1 exercise - 3 x daily - 7 x weekly - 1 sets - 3 reps - 60 hold; (VOR X 1 in sitting))    Consulted and Agree with Plan of Care Patient           Patient will benefit from skilled therapeutic intervention in order to improve the following deficits and impairments:  Dizziness, Decreased balance, Difficulty walking  Visit Diagnosis: Dizziness and giddiness - Plan: PT plan of care cert/re-cert     Problem List There are no problems to display for this patient.  Phillips Grout PT, DPT, GCS  Jamie Trevino 04/23/2020, 9:39 AM  [place] MAIN [facility] SERVICES [address] [place], [place], [address] Phone: [phone]   Fax:  [phone]  Name: Jamie Trevino MRN: [id number] Date of Birth: [birth year]

## 2020-04-30 ENCOUNTER — Ambulatory Visit: Payer: Medicaid Other

## 2020-04-30 ENCOUNTER — Other Ambulatory Visit: Payer: Self-pay

## 2020-04-30 DIAGNOSIS — R42 Dizziness and giddiness: Secondary | ICD-10-CM

## 2020-04-30 DIAGNOSIS — R2681 Unsteadiness on feet: Secondary | ICD-10-CM

## 2020-04-30 NOTE — Therapy (Signed)
[place] MAIN [facility] SERVICES [address] [place], [place], [address] Phone: [phone]   Fax:  [phone]  Physical Therapy Treatment/Discharge  Patient Details  Name: Kylie Simmonds MRN: [id number] Date of Birth: Jan 07, 1956 Referring Provider (PT): Dr. Manuella Ghazi   Encounter Date: 04/30/2020   PT End of Session - 04/30/20 0806    Visit Number 8    Number of Visits 13    Date for PT Re-Evaluation 05/21/20    PT Start Time 0803    PT Stop Time 0825    PT Time Calculation (min) 22 min    Activity Tolerance Patient tolerated treatment well    Behavior During Therapy [facility] for tasks assessed/performed           Past Medical History:  Diagnosis Date  . Hypertension     Past Surgical History:  Procedure Laterality Date  . ABDOMINAL HYSTERECTOMY    . adenomatous colon polyps  09/14/2016  . COLONOSCOPY N/A 12/22/2016   Procedure: COLONOSCOPY;  Surgeon: Manya Silvas, MD;  Location: [facility] ENDOSCOPY;  Service: Endoscopy;  Laterality: N/A;  . TUBAL LIGATION      There were no vitals filed for this visit.   Subjective Assessment - 04/30/20 0805    Subjective Pt reports no further episodes of vertigo over the last week. She feels 100% improved since when she started therapy. She has been able to look up toward the ceiling as well as under the bed without issue. No specific questions or concerns at this time.    Patient is accompained by: Interpreter    Pertinent History Patient reports that she continues with the issue in her head and "I have the dizziness". Patient reports bending down, lying down, rolling over and look up causes dizziness and heaviness in her head. Patient reports if she is trying to get up after lying down she feels she is losing her balance. Patient reports vertigo that lasts a short time. Patient reports that in the beginning she would sleep sitting up because the dizziness was very strong. Patient reports that now she  feels she is losing her balance. Patient reports that she veers when she walks. Patient reports at first she went to the ENT doctor and was getting treatment for the dizziness, but states it keeps coming back. They told her it was not coming from the inner ear and she was referred to the neurologist. Patient reports she is getting episodes of dizziness and also states the dizziness is always there and needs to take pills "so that is why they are looking at my head". Patient reports that now the problem is more when she bends down to try to pick up something. Patient reports she has been laid off work but is scheduled to start back next week. 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 Patient reports that she used to have difficulty with cleaning the mirrors as it would cause dizziness when she would look up. Patient reports she feels pressure in the top back of her head. Scheduled for a brain MRI tomorrow.    Diagnostic tests VNG-normal per MR    Patient Stated Goals to have decreased dizziness    Currently in Pain? No/denies              TREATMENT   Canalith Repositioning Maneuver: On inverted mat table, performed left and right Dix-Hallpike testing. Left Hallpike  was negative. R Hallpike was negative for nystagmus however pt reports a "very slight" dizziness for a few seconds. Proceeded with one bout of Epley maneuver with 1 minute holds in each position. Following Epley maneuver performed R sidelying test which is negative for both vertigo and nystagmus. Proceeded with one bout of the Liberatory maneuver for R side with one minute holds in each position. Discharge instructions provided.                           PT Short Term Goals - 04/30/20 0831      PT SHORT TERM GOAL #1   Title Patient will be able to perform home program independently for self-management and for improved function at  home and work.    Time 4    Period Weeks    Status Achieved             PT Long Term Goals - 04/30/20 4599      PT LONG TERM GOAL #1   Title Patient will reduce falls risk as indicated by Activities Specific Balance Confidence Scale (ABC) 80% or greater.    Baseline scored 71.3 on 03/02/20; 04/23/20: 99.4%    Time 8    Period Weeks    Status Achieved      PT LONG TERM GOAL #2   Title Patient will have improved FOTO score of 5 points or more to indicate improved ability to perform ADLs and walking in her daily life.    Baseline scored 65/100 on 03/02/2020; 04/23/20: 69    Time 8    Period Weeks    Status Partially Met      PT LONG TERM GOAL #3   Title Patient will report 50% or greater improvement in her symptoms of dizziness and imbalance with provoking motions or positions.    Baseline 04/23/20: 100% improvement    Time 8    Period Weeks    Status Achieved                 Plan - 04/30/20 7741    Clinical Impression Statement Pt reports 100% resolution of her symptoms today compared to when she started with therapy. She has now had two weeks without any vertigo. During last session pt completed FOTO, ABC, and [place] in Spanish with the assistance of interpreter. FOTO improved from 65 to 69. ABC improved from 71% to 99%, and DHI improved from 34 to 10. Today on inverted mat table, performed left and right Dix-Hallpike testing. Left Hallpike was negative. R Hallpike was negative for nystagmus however pt reports a "very slight" dizziness for a few seconds. Proceeded with one bout of Epley maneuver with 1 minute holds in each position. Following Epley maneuver performed R sidelying test which is negative for both vertigo and nystagmus. Proceeded with one bout of the Liberatory maneuver for R side with one minute holds in each position. Pt will be discharged on this date having met goals and now reporting 100% resolution of her symptoms for 2 weeks. Pt educated about possibility of  recurrence of symptoms and encouraged to contact her PCP or referring provider if symptoms recur.    Personal Factors and Comorbidities Past/Current Experience;Comorbidity 1;Time since onset of injury/illness/exacerbation    Comorbidities HTN    Examination-Activity Limitations Bend;Reach Overhead    Examination-Participation Restrictions Cleaning    Stability/Clinical Decision Making Evolving/Moderate complexity    Rehab Potential Fair    PT Frequency 1x /  week    PT Duration 4 weeks    PT Treatment/Interventions Canalith Repostioning;Gait training;Stair training;Therapeutic activities;Therapeutic exercise;Balance training;Neuromuscular re-education;Patient/family education;Vestibular    PT Next Visit Plan canal testing until clearance of particles is obtained; then complete vestibulo-ocular exam    PT Home Exercise Plan Access Code: [email or web address] (VOR X1 exercise - 3 x daily - 7 x weekly - 1 sets - 3 reps - 60 hold; (VOR X 1 in sitting))    Consulted and Agree with Plan of Care Patient           Patient will benefit from skilled therapeutic intervention in order to improve the following deficits and impairments:  Dizziness, Decreased balance, Difficulty walking  Visit Diagnosis: Dizziness and giddiness  Unsteadiness on feet     Problem List There are no problems to display for this patient.  Phillips Grout PT, DPT, GCS  Henley Blyth 04/30/2020, 8:39 AM  [place] MAIN [facility] SERVICES [address] [place], [place], [address] Phone: [phone]   Fax:  [phone]  Name: Chantelle Verdi MRN: [id number] Date of Birth: 09-03-1956

## 2020-05-07 ENCOUNTER — Ambulatory Visit: Payer: Medicaid Other

## 2020-09-17 DIAGNOSIS — K921 Melena: Secondary | ICD-10-CM | POA: Insufficient documentation

## 2020-09-17 DIAGNOSIS — I77810 Thoracic aortic ectasia: Secondary | ICD-10-CM | POA: Insufficient documentation

## 2021-01-13 ENCOUNTER — Encounter: Payer: Self-pay | Admitting: *Deleted

## 2021-01-14 ENCOUNTER — Encounter
Admission: RE | Disposition: A | Discharge status: Home / Self Care | Payer: Self-pay | Source: Home / Self Care | Attending: Gastroenterology

## 2021-01-14 ENCOUNTER — Ambulatory Visit
Admission: RE | Admit: 2021-01-14 | Discharge: 2021-01-14 | Disposition: A | Discharge status: Home / Self Care | Payer: Medicaid Other | Attending: Gastroenterology | Admitting: Gastroenterology

## 2021-01-14 ENCOUNTER — Ambulatory Visit: Payer: Medicaid Other | Admitting: Certified Registered Nurse Anesthetist

## 2021-01-14 DIAGNOSIS — K625 Hemorrhage of anus and rectum: Secondary | ICD-10-CM | POA: Insufficient documentation

## 2021-01-14 DIAGNOSIS — Z791 Long term (current) use of non-steroidal anti-inflammatories (NSAID): Secondary | ICD-10-CM | POA: Insufficient documentation

## 2021-01-14 DIAGNOSIS — K5730 Diverticulosis of large intestine without perforation or abscess without bleeding: Secondary | ICD-10-CM | POA: Insufficient documentation

## 2021-01-14 DIAGNOSIS — K640 First degree hemorrhoids: Secondary | ICD-10-CM | POA: Insufficient documentation

## 2021-01-14 DIAGNOSIS — Z79899 Other long term (current) drug therapy: Secondary | ICD-10-CM | POA: Diagnosis not present

## 2021-01-14 DIAGNOSIS — D120 Benign neoplasm of cecum: Secondary | ICD-10-CM | POA: Diagnosis not present

## 2021-01-14 DIAGNOSIS — I10 Essential (primary) hypertension: Secondary | ICD-10-CM | POA: Diagnosis not present

## 2021-01-14 HISTORY — DX: Nontoxic multinodular goiter: E04.2

## 2021-01-14 HISTORY — PX: COLONOSCOPY WITH PROPOFOL: SHX5780

## 2021-01-14 HISTORY — DX: Cardiac arrhythmia, unspecified: I49.9

## 2021-01-14 HISTORY — DX: Personal history of adenomatous and serrated colon polyps: Z86.0101

## 2021-01-14 HISTORY — DX: Personal history of colonic polyps: Z86.010

## 2021-01-14 SURGERY — COLONOSCOPY WITH PROPOFOL
Anesthesia: General

## 2021-01-14 MED ORDER — SODIUM CHLORIDE 0.9 % IV SOLN
INTRAVENOUS | Status: DC
  Administered 2021-01-14: 20 mL/h via INTRAVENOUS

## 2021-01-14 MED ORDER — EPHEDRINE SULFATE 50 MG/ML IJ SOLN
INTRAMUSCULAR | Status: DC | PRN
  Administered 2021-01-14: 10 mg via INTRAVENOUS

## 2021-01-14 MED ORDER — PROPOFOL 10 MG/ML IV BOLUS
INTRAVENOUS | Status: DC | PRN
  Administered 2021-01-14: 30 mg via INTRAVENOUS
  Administered 2021-01-14: 50 mg via INTRAVENOUS

## 2021-01-14 MED ORDER — EPHEDRINE 5 MG/ML INJ
INTRAVENOUS | Status: AC
  Filled 2021-01-14: qty 10

## 2021-01-14 MED ORDER — LIDOCAINE HCL (PF) 2 % IJ SOLN
INTRAMUSCULAR | Status: AC
  Filled 2021-01-14: qty 8

## 2021-01-14 MED ORDER — PROPOFOL 500 MG/50ML IV EMUL
INTRAVENOUS | Status: AC
  Filled 2021-01-14: qty 100

## 2021-01-14 MED ORDER — PROPOFOL 500 MG/50ML IV EMUL
INTRAVENOUS | Status: DC | PRN
  Administered 2021-01-14: 80 ug/kg/min via INTRAVENOUS

## 2021-01-14 MED ORDER — PROPOFOL 10 MG/ML IV BOLUS
INTRAVENOUS | Status: AC
  Filled 2021-01-14: qty 40

## 2021-01-14 NOTE — Interval H&P Note (Signed)
History and Physical Interval Note:  01/14/2021 8:49 AM  Jamie Trevino  has presented today for surgery, with the diagnosis of rectal bleeding.  The various methods of treatment have been discussed with the patient and family. After consideration of risks, benefits and other options for treatment, the patient has consented to  Procedure(s) with comments: COLONOSCOPY WITH PROPOFOL (N/A) - SPANISH INTERPRETER as a surgical intervention.  The patient's history has been reviewed, patient examined, no change in status, stable for surgery.  I have reviewed the patient's chart and labs.  Questions were answered to the patient's satisfaction.     Lesly Rubenstein  Ok to proceed with colonoscopy

## 2021-01-14 NOTE — Transfer of Care (Signed)
Immediate Anesthesia Transfer of Care Note  Patient: Jamie Trevino  Procedure(s) Performed: COLONOSCOPY WITH PROPOFOL (N/A )  Patient Location: PACU and Endoscopy Unit  Anesthesia Type:General  Level of Consciousness: awake  Airway & Oxygen Therapy: Patient Spontanous Breathing  Post-op Assessment: Report given to RN and Post -op Vital signs reviewed and stable  Post vital signs: Reviewed and stable  Last Vitals:  Vitals Value Taken Time  BP 123/56 01/14/21 0913  Temp 35.8 C 01/14/21 0912  Pulse 54 01/14/21 0914  Resp 23 01/14/21 0914  SpO2 99 % 01/14/21 0914  Vitals shown include unvalidated device data.  Last Pain:  Vitals:   01/14/21 0912  TempSrc: Temporal  PainSc: 0-No pain         Complications: No complications documented.

## 2021-01-14 NOTE — Anesthesia Preprocedure Evaluation (Signed)
Anesthesia Evaluation  Patient identified by MRN, date of birth, ID band Patient awake    Reviewed: Allergy & Precautions, NPO status , Patient's Chart, lab work & pertinent test results, reviewed documented beta blocker date and time   Airway Mallampati: III  TM Distance: <3 FB     Dental  (+) Chipped   Pulmonary neg pulmonary ROS,    Pulmonary exam normal        Cardiovascular hypertension, Pt. on home beta blockers and Pt. on medications Normal cardiovascular exam+ dysrhythmias      Neuro/Psych negative neurological ROS  negative psych ROS   GI/Hepatic negative GI ROS, Neg liver ROS,   Endo/Other  negative endocrine ROS  Renal/GU negative Renal ROS     Musculoskeletal negative musculoskeletal ROS (+)   Abdominal Normal abdominal exam  (+)   Peds  Hematology negative hematology ROS (+)   Anesthesia Other Findings . Arrhythmia 40 yrs ago  . H/O adenomatous polyp of colon 09/14/2016  . Hypertension  . 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  Reproductive/Obstetrics                             Anesthesia Physical  Anesthesia Plan  ASA: II  Anesthesia Plan: General   Post-op Pain Management:    Induction: Intravenous  PONV Risk Score and Plan: 2 and Propofol infusion and TIVA  Airway Management Planned: Natural Airway and Nasal Cannula  Additional Equipment:   Intra-op Plan:   Post-operative Plan:   Informed Consent: I have reviewed the patients History and Physical, chart, labs and discussed the procedure including the risks, benefits and alternatives for the proposed anesthesia with the patient or authorized representative who has indicated his/her understanding and acceptance.     Dental advisory given  Plan Discussed with: CRNA, Surgeon and Anesthesiologist  Anesthesia Plan Comments:         Anesthesia Quick  Evaluation

## 2021-01-14 NOTE — H&P (Signed)
Outpatient short stay form Pre-procedure 01/14/2021 8:45 AM Raylene Miyamoto MD, MPH  Primary Physician: [facility]  Reason for visit:  Rectal bleeding  History of present illness:   65 y/o lady with history of hypertension here for colonoscopy for history of rectal bleeding. Had colonoscopy in 2018 that was overall normal. No blood thinners. History of hysterectomy. No family history of GI malignancies.    Current Facility-Administered Medications:  .  0.9 %  sodium chloride infusion, , Intravenous, Continuous, Esma Kilts, Hilton Cork, MD, Last Rate: 20 mL/hr at 01/14/21 0830, 20 mL/hr at 01/14/21 0830  Medications Prior to Admission  Medication Sig Dispense Refill Last Dose  . amLODipine (NORVASC) 10 MG tablet Take 10 mg by mouth daily.   01/14/2021 at 0700  . hydrALAZINE (APRESOLINE) 25 MG tablet Take 25 mg by mouth 3 (three) times daily.   01/13/2021 at Unknown time  . losartan (COZAAR) 100 MG tablet Take 100 mg by mouth daily.   01/13/2021 at Unknown time  . meclizine (ANTIVERT) 12.5 MG tablet Take 12.5 mg by mouth 3 (three) times daily as needed for dizziness.   01/13/2021 at Unknown time  . meloxicam (MOBIC) 7.5 MG tablet Take 7.5 mg by mouth daily.   01/13/2021 at Unknown time  . metoprolol succinate (TOPROL-XL) 100 MG 24 hr tablet Take 100 mg by mouth daily. Take with or immediately following a meal.   01/13/2021 at Unknown time  . HYDROcodone-acetaminophen (NORCO/VICODIN) 5-325 MG tablet Take 1 tablet by mouth every 6 (six) hours as needed for moderate pain. (Patient not taking: Reported on 12/22/2016) 15 tablet 0   . levofloxacin (LEVAQUIN) 750 MG tablet Take 1 tablet (750 mg total) by mouth daily. (Patient not taking: Reported on 12/22/2016) 5 tablet 0      Allergies  Allergen Reactions  . Cortisone     Dizziness      Past Medical History:  Diagnosis Date  . Dysrhythmia   . History of adenomatous polyp of colon   . Hypertension   . Multiple thyroid nodules      Review of systems:  Otherwise negative.    Physical Exam  Gen: Alert, oriented. Appears stated age.  HEENT: PERRLA. Lungs: No respiratory distress CV: RRR Abd: soft, benign, no masses Ext: No edema    Planned procedures: Proceed with colonoscopy. The patient understands the nature of the planned procedure, indications, risks, alternatives and potential complications including but not limited to bleeding, infection, perforation, damage to internal organs and possible oversedation/side effects from anesthesia. The patient agrees and gives consent to proceed.  Please refer to procedure notes for findings, recommendations and patient disposition/instructions.     Raylene Miyamoto MD, MPH Gastroenterology 01/14/2021  8:45 AM

## 2021-01-14 NOTE — OR Nursing (Signed)
Used [facility] [employer] [facility] [place] [phone]) for admission.  Norlene Campbell, RN Endoscopy

## 2021-01-14 NOTE — Op Note (Signed)
[facility] Gastroenterology Patient Name: Jamie Trevino Procedure Date: 01/14/2021 8:51 AM MRN: [id number] Account #: [id number] Date of Birth: 12-29-1955 Admit Type: Outpatient Age: 65 Room: [facility] ENDO ROOM 3 Gender: Female Note Status: Finalized Procedure:             Colonoscopy Indications:           Rectal bleeding Providers:             Andrey Farmer MD, MD Referring MD:          No Local Md, MD (Referring MD) Medicines:             Monitored Anesthesia Care Complications:         No immediate complications. Estimated blood loss:                         Minimal. Procedure:             Pre-Anesthesia Assessment:                        - Prior to the procedure, a History and Physical was                         performed, and patient medications and allergies were                         reviewed. The patient is competent. The risks and                         benefits of the procedure and the sedation options and                         risks were discussed with the patient. All questions                         were answered and informed consent was obtained.                         Patient identification and proposed procedure were                         verified by the physician, the nurse, the anesthetist                         and the technician in the endoscopy suite. Mental                         Status Examination: alert and oriented. Airway                         Examination: normal oropharyngeal airway and neck                         mobility. Respiratory Examination: clear to                         auscultation. CV Examination: normal. Prophylactic                         Antibiotics:  The patient does not require prophylactic                         antibiotics. Prior Anticoagulants: The patient has                         taken no previous anticoagulant or antiplatelet                         agents. ASA Grade Assessment: II - A  patient with mild                         systemic disease. After reviewing the risks and                         benefits, the patient was deemed in satisfactory                         condition to undergo the procedure. The anesthesia                         plan was to use monitored anesthesia care (MAC).                         Immediately prior to administration of medications,                         the patient was re-assessed for adequacy to receive                         sedatives. The heart rate, respiratory rate, oxygen                         saturations, blood pressure, adequacy of pulmonary                         ventilation, and response to care were monitored                         throughout the procedure. The physical status of the                         patient was re-assessed after the procedure.                        After obtaining informed consent, the colonoscope was                         passed under direct vision. Throughout the procedure,                         the patient's blood pressure, pulse, and oxygen                         saturations were monitored continuously. The                         Colonoscope was introduced through the anus and  advanced to the the cecum, identified by appendiceal                         orifice and ileocecal valve. The colonoscopy was                         performed without difficulty. The patient tolerated                         the procedure well. The quality of the bowel                         preparation was good. Findings:      The perianal and digital rectal examinations were normal.      A less than 1 mm polyp was found in the cecum. The polyp was sessile.       The polyp was removed with a jumbo cold forceps. Resection and retrieval       were complete. Estimated blood loss was minimal.      A few small-mouthed diverticula were found in the sigmoid colon.      Internal hemorrhoids  were found during retroflexion. The hemorrhoids       were Grade I (internal hemorrhoids that do not prolapse).      The exam was otherwise without abnormality on direct and retroflexion       views. Impression:            - One less than 1 mm polyp in the cecum, removed with                         a jumbo cold forceps. Resected and retrieved.                        - Diverticulosis in the sigmoid colon.                        - Internal hemorrhoids.                        - The examination was otherwise normal on direct and                         retroflexion views. Recommendation:        - Discharge patient to home.                        - Resume previous diet.                        - Continue present medications.                        - Await pathology results.                        - Repeat colonoscopy for surveillance based on                         pathology results.                        -  Return to referring physician as previously                         scheduled. Procedure Code(s):     --- Professional ---                        [phone], Colonoscopy, flexible; with biopsy, single or                         multiple Diagnosis Code(s):     --- Professional ---                        K63.5, Polyp of colon                        K64.0, First degree hemorrhoids                        K62.5, Hemorrhage of anus and rectum                        K57.30, Diverticulosis of large intestine without                         perforation or abscess without bleeding CPT copyright 2019 American Medical Association. All rights reserved. The codes documented in this report are preliminary and upon coder review may  be revised to meet current compliance requirements. Andrey Farmer MD, MD 01/14/2021 9:13:25 AM Number of Addenda: 0 Note Initiated On: 01/14/2021 8:51 AM Scope Withdrawal Time: 0 hours 8 minutes 1 second  Total Procedure Duration: 0 hours 11 minutes 14 seconds  Estimated  Blood Loss:  Estimated blood loss was minimal.      [facility]

## 2021-01-14 NOTE — Anesthesia Postprocedure Evaluation (Signed)
Anesthesia Post Note  Patient: Jamie Trevino  Procedure(s) Performed: COLONOSCOPY WITH PROPOFOL (N/A )  Patient location during evaluation: Phase II Anesthesia Type: General Level of consciousness: awake and alert, awake and oriented Pain management: pain level controlled Vital Signs Assessment: post-procedure vital signs reviewed and stable Respiratory status: spontaneous breathing, nonlabored ventilation and respiratory function stable Cardiovascular status: blood pressure returned to baseline and stable Postop Assessment: no apparent nausea or vomiting Anesthetic complications: no   No complications documented.   Last Vitals:  Vitals:   01/14/21 0817 01/14/21 0912  BP: (!) 207/97   Pulse: (!) 49   Resp: 20   Temp: (!) 35.8 C (!) 35.8 C  SpO2: 100%     Last Pain:  Vitals:   01/14/21 0932  TempSrc:   PainSc: 0-No pain                 Phill Mutter

## 2021-01-17 ENCOUNTER — Encounter: Payer: Self-pay | Admitting: Gastroenterology

## 2021-01-17 LAB — SURGICAL PATHOLOGY

## 2021-01-26 ENCOUNTER — Other Ambulatory Visit: Payer: Self-pay | Admitting: Primary Care

## 2021-01-26 ENCOUNTER — Other Ambulatory Visit (HOSPITAL_COMMUNITY): Payer: Self-pay | Admitting: Primary Care

## 2021-01-26 DIAGNOSIS — R1084 Generalized abdominal pain: Secondary | ICD-10-CM

## 2021-01-28 ENCOUNTER — Other Ambulatory Visit: Payer: Self-pay | Admitting: Primary Care

## 2021-01-28 ENCOUNTER — Ambulatory Visit
Admission: RE | Admit: 2021-01-28 | Discharge: 2021-01-28 | Disposition: A | Discharge status: Home / Self Care | Payer: Medicaid Other | Attending: Primary Care | Admitting: Primary Care

## 2021-01-28 ENCOUNTER — Ambulatory Visit
Admission: RE | Admit: 2021-01-28 | Discharge: 2021-01-28 | Disposition: A | Discharge status: Home / Self Care | Payer: Medicaid Other | Source: Ambulatory Visit | Attending: Primary Care | Admitting: Primary Care

## 2021-01-28 DIAGNOSIS — M545 Low back pain, unspecified: Secondary | ICD-10-CM | POA: Insufficient documentation

## 2021-03-11 ENCOUNTER — Emergency Department
Admission: EM | Admit: 2021-03-11 | Discharge: 2021-03-11 | Disposition: A | Discharge status: Home / Self Care | Payer: Medicaid Other | Attending: Emergency Medicine | Admitting: Emergency Medicine

## 2021-03-11 ENCOUNTER — Encounter: Payer: Self-pay | Admitting: Emergency Medicine

## 2021-03-11 ENCOUNTER — Ambulatory Visit
Admission: EM | Admit: 2021-03-11 | Discharge: 2021-03-11 | Disposition: A | Discharge status: Home / Self Care | Payer: Medicaid Other | Attending: Medical Oncology | Admitting: Medical Oncology

## 2021-03-11 ENCOUNTER — Emergency Department: Payer: Medicaid Other

## 2021-03-11 ENCOUNTER — Other Ambulatory Visit: Payer: Self-pay

## 2021-03-11 DIAGNOSIS — I10 Essential (primary) hypertension: Secondary | ICD-10-CM | POA: Diagnosis not present

## 2021-03-11 DIAGNOSIS — R1032 Left lower quadrant pain: Secondary | ICD-10-CM

## 2021-03-11 DIAGNOSIS — Z79899 Other long term (current) drug therapy: Secondary | ICD-10-CM | POA: Insufficient documentation

## 2021-03-11 DIAGNOSIS — Z86010 Personal history of colonic polyps: Secondary | ICD-10-CM | POA: Insufficient documentation

## 2021-03-11 DIAGNOSIS — R109 Unspecified abdominal pain: Secondary | ICD-10-CM | POA: Diagnosis present

## 2021-03-11 DIAGNOSIS — R1012 Left upper quadrant pain: Secondary | ICD-10-CM | POA: Diagnosis not present

## 2021-03-11 DIAGNOSIS — K529 Noninfective gastroenteritis and colitis, unspecified: Secondary | ICD-10-CM | POA: Diagnosis not present

## 2021-03-11 LAB — URINALYSIS, COMPLETE (UACMP) WITH MICROSCOPIC
Bacteria, UA: NONE SEEN
Bilirubin Urine: NEGATIVE
Glucose, UA: NEGATIVE mg/dL
Ketones, ur: NEGATIVE mg/dL
Leukocytes,Ua: NEGATIVE
Nitrite: NEGATIVE
Protein, ur: NEGATIVE mg/dL
Specific Gravity, Urine: 1.014 (ref 1.005–1.030)
pH: 5 (ref 5.0–8.0)

## 2021-03-11 LAB — HEPATIC FUNCTION PANEL
ALT: 25 U/L (ref 0–44)
AST: 32 U/L (ref 15–41)
Albumin: 3.9 g/dL (ref 3.5–5.0)
Alkaline Phosphatase: 59 U/L (ref 38–126)
Bilirubin, Direct: 0.2 mg/dL (ref 0.0–0.2)
Indirect Bilirubin: 1.1 mg/dL — ABNORMAL HIGH (ref 0.3–0.9)
Total Bilirubin: 1.3 mg/dL — ABNORMAL HIGH (ref 0.3–1.2)
Total Protein: 7.1 g/dL (ref 6.5–8.1)

## 2021-03-11 LAB — BASIC METABOLIC PANEL
Anion gap: 9 (ref 5–15)
BUN: 19 mg/dL (ref 8–23)
CO2: 22 mmol/L (ref 22–32)
Calcium: 8.8 mg/dL — ABNORMAL LOW (ref 8.9–10.3)
Chloride: 105 mmol/L (ref 98–111)
Creatinine, Ser: 0.48 mg/dL (ref 0.44–1.00)
GFR, Estimated: >60 mL/min (ref >60)
Glucose, Bld: 114 mg/dL — ABNORMAL HIGH (ref 70–99)
Potassium: 3.8 mmol/L (ref 3.5–5.1)
Sodium: 136 mmol/L (ref 135–145)

## 2021-03-11 LAB — CBC
HCT: 41.2 % (ref 36.0–46.0)
Hemoglobin: 13.4 g/dL (ref 12.0–15.0)
MCH: 27.6 pg (ref 26.0–34.0)
MCHC: 32.5 g/dL (ref 30.0–36.0)
MCV: 84.8 fL (ref 80.0–100.0)
Platelets: 284 10*3/uL (ref 150–400)
RBC: 4.86 MIL/uL (ref 3.87–5.11)
RDW: 14.4 % (ref 11.5–15.5)
WBC: 8.3 10*3/uL (ref 4.0–10.5)
nRBC: 0 % (ref 0.0–0.2)

## 2021-03-11 LAB — LIPASE, BLOOD: Lipase: 45 U/L (ref 11–51)

## 2021-03-11 MED ORDER — DICYCLOMINE HCL 10 MG PO CAPS
10.0000 mg | ORAL_CAPSULE | Freq: Once | ORAL | Status: AC
  Administered 2021-03-11: 10 mg via ORAL
  Filled 2021-03-11: qty 1

## 2021-03-11 MED ORDER — KETOROLAC TROMETHAMINE 30 MG/ML IJ SOLN
30.0000 mg | Freq: Once | INTRAMUSCULAR | Status: AC
  Administered 2021-03-11: 30 mg via INTRAVENOUS
  Filled 2021-03-11: qty 1

## 2021-03-11 MED ORDER — DICYCLOMINE HCL 10 MG PO CAPS: 10.0000 mg | ORAL_CAPSULE | Freq: Four times a day (QID) | ORAL | 0 refills | Status: DC

## 2021-03-11 MED ORDER — KETOROLAC TROMETHAMINE 10 MG PO TABS: 10.0000 mg | ORAL_TABLET | Freq: Four times a day (QID) | ORAL | 0 refills | Status: DC | PRN

## 2021-03-11 MED ORDER — IOHEXOL 350 MG/ML SOLN
100.0000 mL | Freq: Once | INTRAVENOUS | Status: AC | PRN
  Administered 2021-03-11: 100 mL via INTRAVENOUS
  Filled 2021-03-11: qty 100

## 2021-03-11 MED ORDER — ONDANSETRON HCL 4 MG/2ML IJ SOLN
4.0000 mg | Freq: Once | INTRAMUSCULAR | Status: AC
  Administered 2021-03-11: 4 mg via INTRAVENOUS
  Filled 2021-03-11: qty 2

## 2021-03-11 MED ORDER — MORPHINE SULFATE (PF) 4 MG/ML IV SOLN
4.0000 mg | Freq: Once | INTRAVENOUS | Status: AC
Start: 2021-03-11 — End: 2021-03-11
  Administered 2021-03-11: 4 mg via INTRAVENOUS
  Filled 2021-03-11: qty 1

## 2021-03-11 NOTE — ED Provider Notes (Signed)
[facility]    CSN: [id number] Arrival date & time: 03/11/21  1337      History   Chief Complaint Chief Complaint  Patient presents with   Abdominal Pain    LUQ    HPI [facility] Julaine Fusi is a 65 y.o. female.   HPI  Abdominal Pain: Pt reports with her daughter. Patient declines a Pharmacologist and verbalizes that she wishes to use her daughter as her translator for today's visit. Per her daughter patient has had LUQ pain for the past 3 days- worsening. Pain described as sharp ache that radiated from her back to the front of her ribcage. Pain is severe in nature. They state that she was seen for this concern today by her PCP and an [place] is scheduled for 03/21/2021 but pain has worsened. She denies fevers, urinary symptoms, constipation, diarrhea, vomiting. Acide reducers and naproxen have offered little relief. No chest pain or SOB.   Past Medical History:  Diagnosis Date   Dysrhythmia    History of adenomatous polyp of colon    Hypertension    Multiple thyroid nodules     There are no problems to display for this patient.   Past Surgical History:  Procedure Laterality Date   ABDOMINAL HYSTERECTOMY     adenomatous colon polyps  09/14/2016   COLONOSCOPY N/A 12/22/2016   Procedure: COLONOSCOPY;  Surgeon: Manya Silvas, MD;  Location: [facility] ENDOSCOPY;  Service: Endoscopy;  Laterality: N/A;   COLONOSCOPY WITH PROPOFOL N/A 01/14/2021   Procedure: COLONOSCOPY WITH PROPOFOL;  Surgeon: Lesly Rubenstein, MD;  Location: [facility] ENDOSCOPY;  Service: Endoscopy;  Laterality: N/A;  SPANISH INTERPRETER   TUBAL LIGATION      OB History   No obstetric history on file.      Home Medications    Prior to Admission medications   Medication Sig Start Date End Date Taking? Authorizing Provider  amLODipine (NORVASC) 10 MG tablet Take 10 mg by mouth daily.   Yes [provider]  hydrALAZINE (APRESOLINE) 25 MG tablet Take 25 mg by mouth 3 (three)  times daily.   Yes [provider]  hydrochlorothiazide (HYDRODIURIL) 25 MG tablet Take 25 mg by mouth daily. 02/28/21  Yes [provider]  metoprolol succinate (TOPROL-XL) 100 MG 24 hr tablet Take 100 mg by mouth daily. Take with or immediately following a meal.   Yes [provider]  sucralfate (CARAFATE) 1 g tablet Take 1 g by mouth 4 (four) times daily. 03/11/21  Yes [provider]  HYDROcodone-acetaminophen (NORCO/VICODIN) 5-325 MG tablet Take 1 tablet by mouth every 6 (six) hours as needed for moderate pain. Patient not taking: No sig reported 10/29/15   Delman Kitten, MD  levofloxacin (LEVAQUIN) 750 MG tablet Take 1 tablet (750 mg total) by mouth daily. Patient not taking: No sig reported 10/29/15   Delman Kitten, MD  losartan (COZAAR) 100 MG tablet Take 100 mg by mouth daily.    [provider]  meclizine (ANTIVERT) 12.5 MG tablet Take 12.5 mg by mouth 3 (three) times daily as needed for dizziness.    [provider]  meloxicam (MOBIC) 7.5 MG tablet Take 7.5 mg by mouth daily.    [provider]    Family History Family History  Problem Relation Age of Onset   Prostate cancer Father    Breast cancer Neg Hx     Social History Social History   Tobacco Use   Smoking status: Never  Smokeless tobacco: Never  Vaping Use   Vaping Use: Never used  Substance Use Topics   Alcohol use: No   Drug use: No     Allergies   Cortisone   Review of Systems Review of Systems  As stated above in HPI Physical Exam Triage Vital Signs ED Triage Vitals  Enc Vitals Group     BP 03/11/21 1416 (!) 165/87     Pulse Rate 03/11/21 1416 91     Resp 03/11/21 1416 14     Temp 03/11/21 1416 98.6 F (37 C)     Temp Source 03/11/21 1416 Oral     SpO2 03/11/21 1416 97 %     Weight 03/11/21 1412 119 lb 14.9 oz (54.4 kg)     Height 03/11/21 1412 5\' 2"  (1.575 m)     Head Circumference --      Peak Flow --      Pain Score 03/11/21 1412  10     Pain Loc --      Pain Edu? --      Excl. in [place]? --    No data found.  Updated Vital Signs BP (!) 165/87 (BP Location: Left Arm)   Pulse 91   Temp 98.6 F (37 C) (Oral)   Resp 14   Ht 5\' 2"  (1.575 m)   Wt 119 lb 14.9 oz (54.4 kg)   SpO2 97%   BMI 21.94 kg/m   Physical Exam Vitals and nursing note reviewed.  Constitutional:      General: She is in acute distress.     Appearance: She is well-developed. She is not ill-appearing, toxic-appearing or diaphoretic.  HENT:     Head: Normocephalic and atraumatic.     Mouth/Throat:     Mouth: Mucous membranes are moist.  Cardiovascular:     Rate and Rhythm: Normal rate and regular rhythm.     Heart sounds: Normal heart sounds.  Pulmonary:     Effort: Pulmonary effort is normal.     Breath sounds: Normal breath sounds.  Abdominal:     General: Abdomen is flat. Bowel sounds are normal.     Palpations: Abdomen is soft. There is no hepatomegaly, splenomegaly or pulsatile mass.     Tenderness: There is abdominal tenderness in the left upper quadrant and left lower quadrant. There is guarding. There is no right CVA tenderness, left CVA tenderness or rebound. Negative signs include Murphy's sign and McBurney's sign.     Hernia: No hernia is present.  Skin:    General: Skin is warm.     Coloration: Skin is not jaundiced or pale.  Neurological:     Mental Status: She is alert.     UC Treatments / Results  Labs (all labs ordered are listed, but only abnormal results are displayed) Labs Reviewed - No data to display  EKG   Radiology No results found.  Procedures Procedures (including critical care time)  Medications Ordered in UC Medications - No data to display  Initial Impression / Assessment and Plan / UC Course  I have reviewed the triage vital signs and the nursing notes.  Pertinent labs & imaging results that were available during my care of the patient were reviewed by me and considered in my medical decision  making (see chart for details).     New.  My first concern is for pancreatitis given the nature of her pain.  As she has no CVA tenderness and the pain is described as radiating  from the kidney area to the front of the rib cage this is less likely to be a kidney stone especially with no hematuria.  With her lower left quadrant abdominal pain she could also have diverticulitis.  I have discussed my concerns with patient and her daughter.  I have recommended that she go immediately to the emergency room for further work-up as she will need a CT scan and lab work.  It is daughter's preference to transport her via private vehicle.  She will be n.p.o. Final Clinical Impressions(s) / UC Diagnoses   Final diagnoses:  None   Discharge Instructions   None    ED Prescriptions   None    PDMP not reviewed this encounter.   Hughie Closs, [place] 03/11/21 1439

## 2021-03-11 NOTE — ED Notes (Signed)
Additional meds given  Describes pain as "cramping"

## 2021-03-11 NOTE — ED Notes (Signed)
Patient is being discharged from the Urgent Care and sent to the Emergency Department via POV. Per Nelwyn Salisbury, PA, patient is in need of higher level of care due to LUQ abdominal pain. Patient is aware and verbalizes understanding of plan of care.  Vitals:   03/11/21 1416  BP: (!) 165/87  Pulse: 91  Resp: 14  Temp: 98.6 F (37 C)  SpO2: 97%

## 2021-03-11 NOTE — ED Notes (Signed)
States pain eased off

## 2021-03-11 NOTE — ED Provider Notes (Signed)
[facility] Emergency Department Provider Note  ____________________________________________   Event Date/Time   First MD Initiated Contact with Patient 03/11/21 1606     (approximate)  I have reviewed the triage vital signs and the nursing notes.   HISTORY  Chief Complaint Flank Pain    HPI Jamie Trevino Jamie Trevino is a 65 y.o. female presents emergency department with abdominal pain.  Patient started having left flank pain about 3 days ago.  Symptoms Severe today.  No vomiting or diarrhea.  No hematuria.  Denies any fever or chills.  Patient was seen at urgent care and instructed, edema. Past Medical History:  Diagnosis Date   Dysrhythmia    History of adenomatous polyp of colon    Hypertension    Multiple thyroid nodules     There are no problems to display for this patient.   Past Surgical History:  Procedure Laterality Date   ABDOMINAL HYSTERECTOMY     adenomatous colon polyps  09/14/2016   COLONOSCOPY N/A 12/22/2016   Procedure: COLONOSCOPY;  Surgeon: Manya Silvas, MD;  Location: [facility] ENDOSCOPY;  Service: Endoscopy;  Laterality: N/A;   COLONOSCOPY WITH PROPOFOL N/A 01/14/2021   Procedure: COLONOSCOPY WITH PROPOFOL;  Surgeon: Lesly Rubenstein, MD;  Location: [facility] ENDOSCOPY;  Service: Endoscopy;  Laterality: N/A;  SPANISH INTERPRETER   TUBAL LIGATION      Prior to Admission medications   Medication Sig Start Date End Date Taking? Authorizing Provider  dicyclomine (BENTYL) 10 MG capsule Take 1 capsule (10 mg total) by mouth 4 (four) times daily for 14 days. 03/11/21 03/25/21 Yes Terrin Imparato, Linden Dolin, PA-C  ketorolac (TORADOL) 10 MG tablet Take 1 tablet (10 mg total) by mouth every 6 (six) hours as needed. 03/11/21  Yes Whittley Carandang, Linden Dolin, PA-C  amLODipine (NORVASC) 10 MG tablet Take 10 mg by mouth daily.    [provider]  hydrALAZINE (APRESOLINE) 25 MG tablet Take 25 mg by mouth 3 (three) times daily.    [provider]   hydrochlorothiazide (HYDRODIURIL) 25 MG tablet Take 25 mg by mouth daily. 02/28/21   [provider]  losartan (COZAAR) 100 MG tablet Take 100 mg by mouth daily.    [provider]  meclizine (ANTIVERT) 12.5 MG tablet Take 12.5 mg by mouth 3 (three) times daily as needed for dizziness.    [provider]  meloxicam (MOBIC) 7.5 MG tablet Take 7.5 mg by mouth daily.    [provider]  metoprolol succinate (TOPROL-XL) 100 MG 24 hr tablet Take 100 mg by mouth daily. Take with or immediately following a meal.    [provider]  sucralfate (CARAFATE) 1 g tablet Take 1 g by mouth 4 (four) times daily. 03/11/21   [provider]    Allergies Cortisone  Family History  Problem Relation Age of Onset   Prostate cancer Father    Breast cancer Neg Hx     Social History Social History   Tobacco Use   Smoking status: Never   Smokeless tobacco: Never  Vaping Use   Vaping Use: Never used  Substance Use Topics   Alcohol use: No   Drug use: No    Review of Systems  Constitutional: No fever/chills Eyes: No visual changes. ENT: No sore throat. Respiratory: Denies cough Cardiovascular: Denies chest pain Gastrointestinal: Positive abdominal pain Genitourinary: Negative for dysuria. Musculoskeletal: Negative for back pain. Skin: Negative for rash. Psychiatric: no mood changes,     ____________________________________________   PHYSICAL  EXAM:  VITAL SIGNS: ED Triage Vitals  Enc Vitals Group     BP 03/11/21 1511 132/90     Pulse Rate 03/11/21 1508 88     Resp 03/11/21 1508 16     Temp 03/11/21 1508 98.2 F (36.8 C)     Temp Source 03/11/21 1508 Oral     SpO2 03/11/21 1508 98 %     Weight 03/11/21 1508 119 lb 14.9 oz (54.4 kg)     Height 03/11/21 1508 5\' 2"  (1.575 m)     Head Circumference --      Peak Flow --      Pain Score 03/11/21 1508 8     Pain Loc --      Pain Edu? --      Excl. in [place]? --     Constitutional:  Alert and oriented. Well appearing and in no acute distress. Eyes: Conjunctivae are normal.  Head: Atraumatic. Nose: No congestion/rhinnorhea. Mouth/Throat: Mucous membranes are moist.   Neck:  supple no lymphadenopathy noted Cardiovascular: Normal rate, regular rhythm. Heart sounds are normal Respiratory: Normal respiratory effort.  No retractions, lungs c t a  Abd: soft tender in the right lower quadrant, minimal tenderness in the left lower quadrant, bs normal all 4 quad GU: deferred Musculoskeletal: FROM all extremities, warm and well perfused Neurologic:  Normal speech and language.  Skin:  Skin is warm, dry and intact. No rash noted. Psychiatric: Mood and affect are normal. Speech and behavior are normal.  ____________________________________________   LABS (all labs ordered are listed, but only abnormal results are displayed)  Labs Reviewed  URINALYSIS, COMPLETE (UACMP) WITH MICROSCOPIC - Abnormal; Notable for the following components:      Result Value   Color, Urine YELLOW (*)    APPearance CLEAR (*)    Hgb urine dipstick MODERATE (*)    All other components within normal limits  BASIC METABOLIC PANEL - Abnormal; Notable for the following components:   Glucose, Bld 114 (*)    Calcium 8.8 (*)    All other components within normal limits  HEPATIC FUNCTION PANEL - Abnormal; Notable for the following components:   Total Bilirubin 1.3 (*)    Indirect Bilirubin 1.1 (*)    All other components within normal limits  CBC  LIPASE, BLOOD   ____________________________________________   ____________________________________________  RADIOLOGY  CT abdomen/pelvis with IV contrast  ____________________________________________   PROCEDURES  Procedure(s) performed: No  Procedures    ____________________________________________   INITIAL IMPRESSION / ASSESSMENT AND PLAN / ED COURSE  Pertinent labs & imaging results that were available during my care of the patient  were reviewed by me and considered in my medical decision making (see chart for details).   The patient is 8 65 year old female presents with abdominal pain.  See HPI.  Physical exam shows patient is stable at this time.  DDx: Diverticulitis, acute appendicitis, kidney stones, bowel obstruction, pancreatitis  CBC, basic metabolic normal, hepatic slight increase in bilirubin, lipase is normal  CT abdomen/pelvis with IV contrast reviewed by me.  RAdiologist states probable ileus or enteritis. Appendix does not have any inflammation although it is large  I explained the findings to the patient and her daughter.  She had relief initially with the morphine but is now having discomfort again.  Gave her toradol and bentyl will reassess  Patient is much more comfortable after the Bentyl and Toradol.  She will be discharged with prescription for both.  She is to follow-up with her  regular doctor if not improving to 3 days.  Return emergency department worsening.  She was discharged stable condition in the care of her daughter.   Jamie Trevino was evaluated in Emergency Department on 03/11/2021 for the symptoms described in the history of present illness. She was evaluated in the context of the global COVID-19 pandemic, which necessitated consideration that the patient might be at risk for infection with the SARS-CoV-2 virus that causes COVID-19. Institutional protocols and algorithms that pertain to the evaluation of patients at risk for COVID-19 are in a state of rapid change based on information released by regulatory bodies including the CDC and federal and state organizations. These policies and algorithms were followed during the patient's care in the ED.    As part of my medical decision making, I reviewed the following data within the [facility] History obtained from family, Nursing notes reviewed and incorporated, Labs reviewed , Old chart reviewed, Radiograph reviewed , Notes  from prior ED visits, and [place] Controlled Substance Database  ____________________________________________   FINAL CLINICAL IMPRESSION(S) / ED DIAGNOSES  Final diagnoses:  Enteritis      NEW MEDICATIONS STARTED DURING THIS VISIT:  New Prescriptions   DICYCLOMINE (BENTYL) 10 MG CAPSULE    Take 1 capsule (10 mg total) by mouth 4 (four) times daily for 14 days.   KETOROLAC (TORADOL) 10 MG TABLET    Take 1 tablet (10 mg total) by mouth every 6 (six) hours as needed.     Note:  This document was prepared using Dragon voice recognition software and may include unintentional dictation errors.    Versie Starks, PA-C 03/11/21 1911    Nance Pear, MD 03/12/21 [phone]

## 2021-03-11 NOTE — ED Triage Notes (Signed)
Patient states that she does not want to use a spanish interpreter at this time.  Patient states that she wants to use her daughter to translate.  Patient's daughter states that she saw her PCP this morning for her pain and is scheduled for an [place] on 03/21/21.  Patient c/o left upper abdominal pain that started yesterday.  Patient states that it radiates to her upper back.  Patient denies N/V.

## 2021-03-11 NOTE — Discharge Instructions (Addendum)
Follow up with your regular doctor if not improving in 2 to 3 days Return to the ER if worsening Use the medication as prescribed

## 2021-03-11 NOTE — ED Triage Notes (Addendum)
Pt to ER via Pov with complaints of left flank pain that radiates down into LLQ. Symptoms started 3 days ago. Sent to ER from UC for further evaluation. Denies urinary symptoms, no hx of kidney stones. No fevers at home.

## 2021-03-17 ENCOUNTER — Encounter: Payer: Self-pay | Admitting: Primary Care

## 2021-03-21 ENCOUNTER — Ambulatory Visit: Payer: Medicaid Other

## 2021-03-22 ENCOUNTER — Encounter: Payer: Self-pay | Admitting: *Deleted

## 2021-03-30 ENCOUNTER — Emergency Department: Payer: Medicaid Other

## 2021-03-30 ENCOUNTER — Other Ambulatory Visit: Payer: Self-pay

## 2021-03-30 ENCOUNTER — Encounter: Payer: Self-pay | Admitting: *Deleted

## 2021-03-30 DIAGNOSIS — I10 Essential (primary) hypertension: Secondary | ICD-10-CM | POA: Diagnosis not present

## 2021-03-30 DIAGNOSIS — R42 Dizziness and giddiness: Secondary | ICD-10-CM | POA: Insufficient documentation

## 2021-03-30 DIAGNOSIS — Z79899 Other long term (current) drug therapy: Secondary | ICD-10-CM | POA: Diagnosis not present

## 2021-03-30 DIAGNOSIS — R519 Headache, unspecified: Secondary | ICD-10-CM | POA: Insufficient documentation

## 2021-03-30 LAB — CBC
HCT: 41.1 % (ref 36.0–46.0)
Hemoglobin: 13.3 g/dL (ref 12.0–15.0)
MCH: 27.4 pg (ref 26.0–34.0)
MCHC: 32.4 g/dL (ref 30.0–36.0)
MCV: 84.7 fL (ref 80.0–100.0)
Platelets: 351 10*3/uL (ref 150–400)
RBC: 4.85 MIL/uL (ref 3.87–5.11)
RDW: 14.5 % (ref 11.5–15.5)
WBC: 8.6 10*3/uL (ref 4.0–10.5)
nRBC: 0 % (ref 0.0–0.2)

## 2021-03-30 LAB — BASIC METABOLIC PANEL
Anion gap: 9 (ref 5–15)
BUN: 18 mg/dL (ref 8–23)
CO2: 25 mmol/L (ref 22–32)
Calcium: 9.2 mg/dL (ref 8.9–10.3)
Chloride: 102 mmol/L (ref 98–111)
Creatinine, Ser: 0.61 mg/dL (ref 0.44–1.00)
GFR, Estimated: >60 mL/min (ref >60)
Glucose, Bld: 159 mg/dL — ABNORMAL HIGH (ref 70–99)
Potassium: 3.9 mmol/L (ref 3.5–5.1)
Sodium: 136 mmol/L (ref 135–145)

## 2021-03-30 NOTE — ED Triage Notes (Signed)
Pt brought in via ems from home with headache/dizziness for 1 hour.  Pt reports nausea.   Pt alert  speech clear.

## 2021-03-31 ENCOUNTER — Emergency Department: Payer: Medicaid Other

## 2021-03-31 ENCOUNTER — Emergency Department
Admission: EM | Admit: 2021-03-31 | Discharge: 2021-03-31 | Disposition: A | Discharge status: Home / Self Care | Payer: Medicaid Other | Attending: Emergency Medicine | Admitting: Emergency Medicine

## 2021-03-31 DIAGNOSIS — R519 Headache, unspecified: Secondary | ICD-10-CM

## 2021-03-31 DIAGNOSIS — R42 Dizziness and giddiness: Secondary | ICD-10-CM

## 2021-03-31 MED ORDER — MECLIZINE HCL 25 MG PO TABS: 25.0000 mg | ORAL_TABLET | Freq: Two times a day (BID) | ORAL | 0 refills | Status: DC | PRN

## 2021-03-31 MED ORDER — MECLIZINE HCL 25 MG PO TABS
25.0000 mg | ORAL_TABLET | Freq: Once | ORAL | Status: AC
  Administered 2021-03-31: 25 mg via ORAL
  Filled 2021-03-31: qty 1

## 2021-03-31 NOTE — ED Notes (Signed)
Patient reports that her dizziness has subsided.

## 2021-03-31 NOTE — ED Notes (Signed)
Patient to MRI via wheelchair.

## 2021-03-31 NOTE — ED Notes (Signed)
Pt alert and oriented x4 verbalized understanding of discharge, explained to daughter as well

## 2021-03-31 NOTE — ED Notes (Signed)
MD at the bedside  

## 2021-03-31 NOTE — ED Provider Notes (Signed)
[facility] Emergency Department Provider Note  ____________________________________________   Event Date/Time   First MD Initiated Contact with Patient 03/31/21 [phone]     (approximate)  I have reviewed the triage vital signs and the nursing notes.   HISTORY  Chief Complaint Headache  The patient and/or family speak(s) Spanish.  They understand they have the right to the use of a hospital interpreter, however at this time they prefer to speak directly with me in [place].  They know that they can ask for an interpreter at any time.  The patient's daughter speaks [place] and is at bedside as well.   HPI Jamie Trevino Jamie Trevino Jamie Trevino is a 65 y.o. female with medical history as listed below and the daughter also says that the patient has a history of vertigo.  The patient presents for evaluation of dizziness, right sided headache, general malaise, and feeling like she is going to fall.  She reports she has not felt well all day and the symptoms were relatively acute in onset this morning and have continued throughout the day.  She said that when she has had vertigo in the past, feels like everything is spinning, but today she just feels off balance like she is going to fall.  She feels poorly in general without any other specific symptoms except for pain in the right side of her head that is throbbing and nonradiating.  She has had no nausea nor vomiting.  No visual changes and no sensitivity to light.  She has had no numbness nor weakness in any particular extremity but feels like she cannot stand up or walk.  She denies chest pain or shortness of breath.  The symptoms are severe.     Past Medical History:  Diagnosis Date   Dysrhythmia    History of adenomatous polyp of colon    Hypertension    Multiple thyroid nodules     There are no problems to display for this patient.   Past Surgical History:  Procedure Laterality Date   ABDOMINAL HYSTERECTOMY     adenomatous  colon polyps  09/14/2016   COLONOSCOPY N/A 12/22/2016   Procedure: COLONOSCOPY;  Surgeon: Manya Silvas, MD;  Location: [facility] ENDOSCOPY;  Service: Endoscopy;  Laterality: N/A;   COLONOSCOPY WITH PROPOFOL N/A 01/14/2021   Procedure: COLONOSCOPY WITH PROPOFOL;  Surgeon: Lesly Rubenstein, MD;  Location: [facility] ENDOSCOPY;  Service: Endoscopy;  Laterality: N/A;  SPANISH INTERPRETER   TUBAL LIGATION      Prior to Admission medications   Medication Sig Start Date End Date Taking? Authorizing Provider  meclizine (ANTIVERT) 25 MG tablet Take 1 tablet (25 mg total) by mouth 2 (two) times daily as needed for dizziness. 03/31/21  Yes Hinda Kehr, MD  amLODipine (NORVASC) 10 MG tablet Take 10 mg by mouth daily.    [provider]  dicyclomine (BENTYL) 10 MG capsule Take 1 capsule (10 mg total) by mouth 4 (four) times daily for 14 days. 03/11/21 03/25/21  Caryn Section Linden Dolin, PA-C  hydrALAZINE (APRESOLINE) 25 MG tablet Take 25 mg by mouth 3 (three) times daily.    [provider]  hydrochlorothiazide (HYDRODIURIL) 25 MG tablet Take 25 mg by mouth daily. 02/28/21   [provider]  ketorolac (TORADOL) 10 MG tablet Take 1 tablet (10 mg total) by mouth every 6 (six) hours as needed. 03/11/21   Fisher, Linden Dolin, PA-C  losartan (COZAAR) 100 MG tablet Take 100 mg by mouth daily.    [provider]  meloxicam (MOBIC) 7.5 MG tablet Take 7.5 mg by mouth daily.    [provider]  metoprolol succinate (TOPROL-XL) 100 MG 24 hr tablet Take 100 mg by mouth daily. Take with or immediately following a meal.    [provider]  sucralfate (CARAFATE) 1 g tablet Take 1 g by mouth 4 (four) times daily. 03/11/21   [provider]    Allergies Cortisone  Family History  Problem Relation Age of Onset   Prostate cancer Father    Breast cancer Neg Hx     Social History Social History   Tobacco Use   Smoking status: Never   Smokeless tobacco: Never  Vaping Use    Vaping Use: Never used  Substance Use Topics   Alcohol use: No   Drug use: No    Review of Systems Constitutional: No fever/chills Eyes: No visual changes. ENT: No sore throat. Cardiovascular: Denies chest pain. Respiratory: Denies shortness of breath. Gastrointestinal: No abdominal pain.  No nausea, no vomiting.  No diarrhea.  No constipation. Genitourinary: Negative for dysuria. Musculoskeletal: Negative for neck pain.  Negative for back pain. Integumentary: Negative for rash. Neurological: Patient feels "dizzy" and off balance as if she is going to fall.  No focal numbness nor weakness.  Right-sided headache.   ____________________________________________   PHYSICAL EXAM:  VITAL SIGNS: ED Triage Vitals  Enc Vitals Group     BP 03/30/21 2112 (!) 162/131     Pulse Rate 03/30/21 2112 71     Resp 03/30/21 2112 20     Temp 03/30/21 2112 97.8 F (36.6 C)     Temp Source 03/30/21 2112 Oral     SpO2 03/30/21 2112 100 %     Weight 03/30/21 2114 54 kg (119 lb)     Height 03/30/21 2114 1.575 m ('5\' 2"'$ )     Head Circumference --      Peak Flow --      Pain Score 03/30/21 2113 5     Pain Loc --      Pain Edu? --      Excl. in [place]? --     Constitutional: Alert and oriented.  Eyes: Conjunctivae are normal.  Pupils are equal and reactive.  Extraocular motion is intact.  No nystagmus. Head: Atraumatic. Nose: No congestion/rhinnorhea. Mouth/Throat: Patient is wearing a mask. Neck: No stridor.  No meningeal signs.   Cardiovascular: Normal rate, regular rhythm. Good peripheral circulation. Respiratory: Normal respiratory effort.  No retractions. Gastrointestinal: Soft and nontender. No distention.  Musculoskeletal: No lower extremity tenderness nor edema. No gross deformities of extremities. Neurologic:  Normal speech and language. No gross focal neurologic deficits are appreciated.  Skin:  Skin is warm, dry and intact. Psychiatric: Mood and affect are normal. Speech and behavior  are normal.  ____________________________________________   LABS (all labs ordered are listed, but only abnormal results are displayed)  Labs Reviewed  BASIC METABOLIC PANEL - Abnormal; Notable for the following components:      Result Value   Glucose, Bld 159 (*)    All other components within normal limits  CBC   ____________________________________________   RADIOLOGY I, Hinda Kehr, personally viewed and evaluated these images (plain radiographs) as part of my medical decision making, as well as reviewing the written report by the radiologist.  ED MD interpretation: CT unremarkable.  MRI brain shows no acute abnormality.  Official radiology report(s): CT Head Wo Contrast  Result Date: 03/30/2021 CLINICAL DATA:  Headache, dizziness and nausea.  EXAM: CT HEAD WITHOUT CONTRAST TECHNIQUE: Contiguous axial images were obtained from the base of the skull through the vertex without intravenous contrast. COMPARISON:  None. FINDINGS: Brain: There is mild cerebral atrophy with widening of the extra-axial spaces and ventricular dilatation. There are areas of decreased attenuation within the white matter tracts of the supratentorial brain, consistent with microvascular disease changes. Vascular: No hyperdense vessel or unexpected calcification. Skull: Normal. Negative for fracture or focal lesion. Sinuses/Orbits: There is marked severity bilateral ethmoid sinus mucosal thickening. Other: None. IMPRESSION: 1. No acute intracranial abnormality. 2. Marked severity bilateral ethmoid sinus disease. Electronically Signed   By: Virgina Norfolk M.D.   On: 03/30/2021 21:43   MR BRAIN WO CONTRAST  Result Date: 03/31/2021 CLINICAL DATA:  Initial evaluation for acute vertigo, dizziness. EXAM: MRI HEAD WITHOUT CONTRAST TECHNIQUE: Multiplanar, multiecho pulse sequences of the brain and surrounding structures were obtained without intravenous contrast. COMPARISON:  CT from 03/30/2021. FINDINGS: Brain: Mild  diffuse prominence of the CSF containing spaces compatible generalized age-related cerebral atrophy. Scattered patchy T2/FLAIR hyperintensity within the periventricular and deep white matter both cerebral hemispheres most consistent with chronic small vessel ischemic disease, mild to moderate in nature. No abnormal foci of restricted diffusion to suggest acute or subacute ischemia. Gray-white matter differentiation maintained. No encephalomalacia to suggest chronic cortical infarction. No acute intracranial hemorrhage. Single punctate focus of susceptibility artifact noted at the subcortical posterior left frontal region, likely a small chronic microhemorrhage, of doubtful significance in isolation. No mass lesion, midline shift or mass effect. No hydrocephalus or extra-axial fluid collection. Pituitary gland suprasellar region within normal limits. Midline structures intact. Vascular: Major intracranial vascular flow voids are maintained. Skull and upper cervical spine: Craniocervical junction within normal limits. Bone marrow signal intensity normal. No scalp soft tissue abnormality. Sinuses/Orbits: Globes and orbital soft tissues within normal limits. Scattered mucosal thickening noted within the ethmoidal air cells and maxillary sinuses. No significant mastoid effusion. Inner ear structures grossly within normal limits. Other: None. IMPRESSION: 1. No acute intracranial abnormality. 2. Age-related cerebral atrophy with mild to moderate chronic small vessel ischemic disease. Electronically Signed   By: Jeannine Boga M.D.   On: 03/31/2021 04:12    ____________________________________________   PROCEDURES   Procedure(s) performed (including Critical Care):  Procedures   ____________________________________________   INITIAL IMPRESSION / MDM / ASSESSMENT AND PLAN / ED COURSE  As part of my medical decision making, I reviewed the following data within the [place] History  obtained from family, Nursing notes reviewed and incorporated, Labs reviewed , Old chart reviewed, and Notes from prior ED visits   Differential diagnosis includes, but is not limited to, peripheral vertigo, central vertigo due to CVA, intracranial hemorrhage, neoplasm, electrolyte or metabolic abnormality, acute infection.   Basic metabolic panel and CBC are within normal limits.  CT without contrast shows no sign of acute abnormality other than severe bilateral ethmoid sinus disease, but the patient reports no facial pain or issues with her sinuses so this may be chronic.  Vital signs are stable and within normal limits.  I discussed the results and encouraging findings with the patient but she is concerned about how this feels different than her prior vertigo.  I have a low suspicion for CVA but given the new symptoms and the severity of them, the patient, daughter, and I agreed on the plan for meclizine 25 mg by mouth and MRI brain to rule out CVA.         Clinical  Course as of 03/31/21 0452  Thu Mar 31, 2021  0326 MR BRAIN WO CONTRAST Reassuring MRI with no evidence of acute abnormality.  The patient is currently sleeping but I spoke with her daughter who reports that the patient was feeling better after the meclizine.  She is comfortable going home at this time.  I provided my usual and customary follow-up recommendations and return precautions and prescription as listed below. [CF]    Clinical Course User Index [CF] Hinda Kehr, MD     ____________________________________________  FINAL CLINICAL IMPRESSION(S) / ED DIAGNOSES  Final diagnoses:  Dizziness  Acute nonintractable headache, unspecified headache type     MEDICATIONS GIVEN DURING THIS VISIT:  Medications  meclizine (ANTIVERT) tablet 25 mg (25 mg Oral Given 03/31/21 0248)     ED Discharge Orders          Ordered    meclizine (ANTIVERT) 25 MG tablet  2 times daily PRN        03/31/21 0451              Note:  This document was prepared using Dragon voice recognition software and may include unintentional dictation errors.   Hinda Kehr, MD 03/31/21 [phone]

## 2021-03-31 NOTE — ED Notes (Addendum)
Patient remains in MRI 

## 2021-03-31 NOTE — ED Notes (Signed)
Patient return from MRI.

## 2021-03-31 NOTE — Discharge Instructions (Addendum)
Your workup in the Emergency Department today was reassuring.  We did not find any specific abnormalities.  We recommend you drink plenty of fluids, take your regular medications and/or any new ones prescribed today, and follow up with the doctor(s) listed in these documents as recommended.  Return to the Emergency Department if you develop new or worsening symptoms that concern you.  

## 2021-05-26 ENCOUNTER — Other Ambulatory Visit: Payer: Self-pay

## 2021-05-26 ENCOUNTER — Ambulatory Visit: Payer: Medicaid Other | Admitting: Gastroenterology

## 2021-07-01 IMAGING — CT CT NECK WITHOUT CONTRAST
5 series · 16 of 33 positions shown, 18 images · non-contrast
Comparison: None.

CLINICAL DATA: Acute parotitis. Bilateral submandibular swelling
for 2-3 years. Patient reports swelling below the left mandible.

EXAM:
CT NECK WITHOUT CONTRAST
TECHNIQUE: Multidetector CT imaging of the neck was performed following the
standard protocol without intravenous contrast.

[Series 2: axial neck · axial · 0.53mm/px · z∈[-696,-588]mm · 3 of 108 slices shown]
[im 27/108  bone]
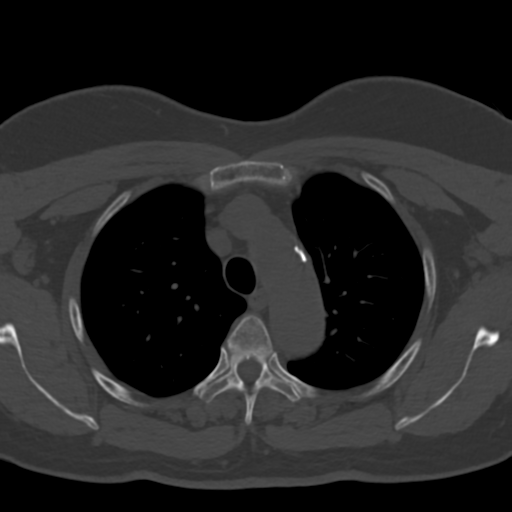
[im 54/108  bone]
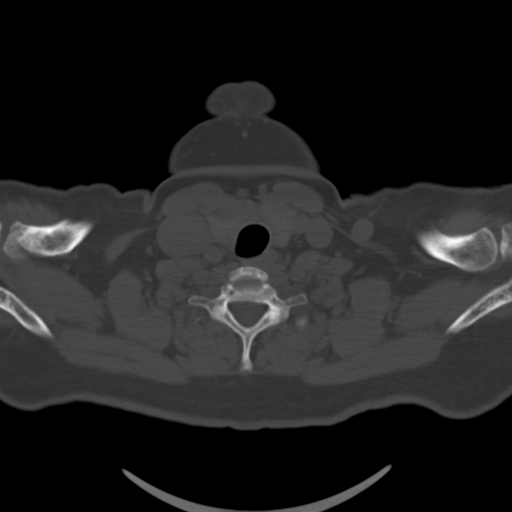
[im 81/108  bone]
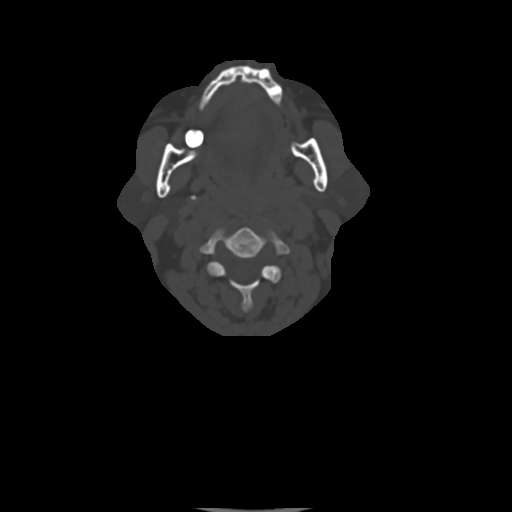

[Series 3: axial bone neck · axial · 0.53mm/px · z∈[-678,-606]mm · 2 of 108 slices shown]
[im 36/108  bone]
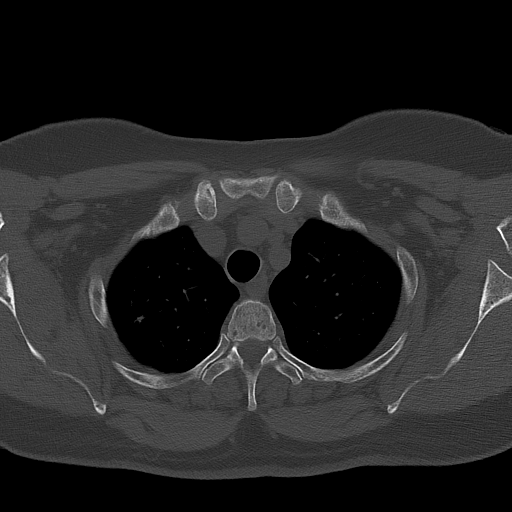
[im 72/108  bone]
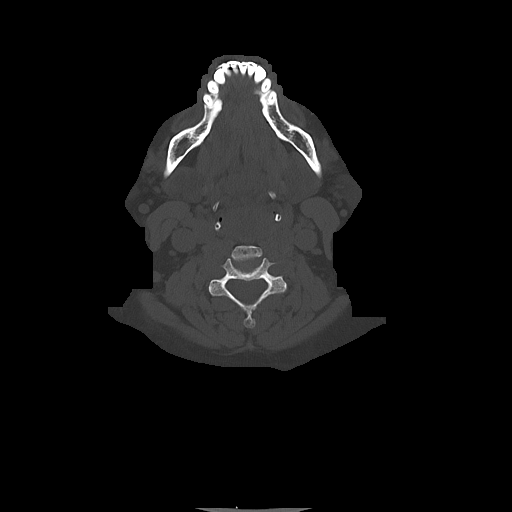

[Series 4: coronal neck · coronal · 0.53mm/px · 3 of 132 slices shown]
[im 30/132  bone]
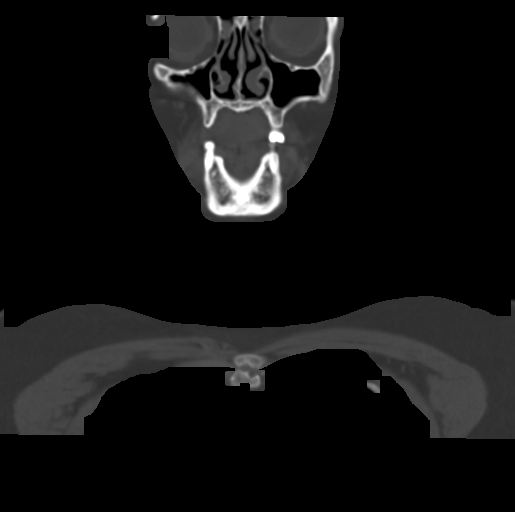
[im 54/132  bone]
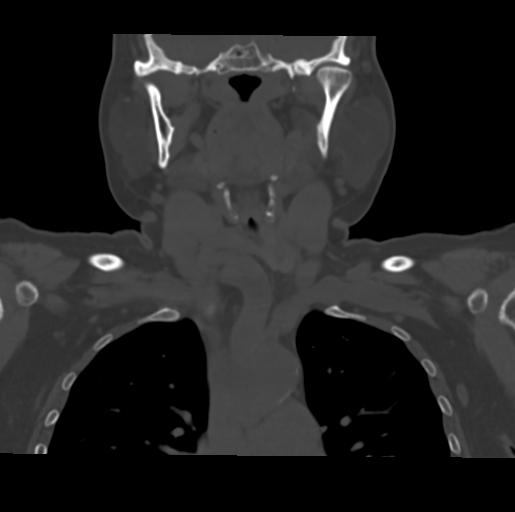
[im 78/132  bone]
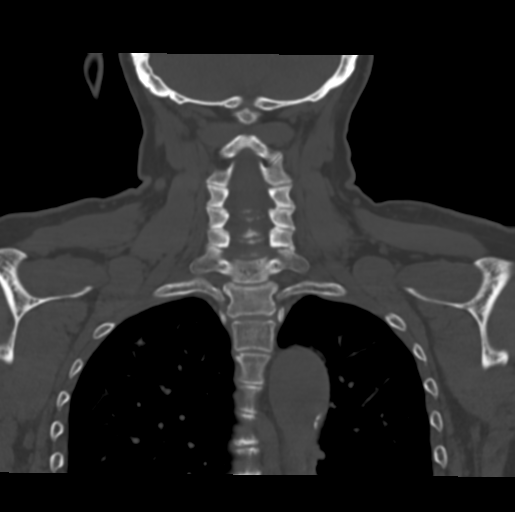

[Series 6: sagittal neck · sagittal · 0.52mm/px · 5 of 136 slices shown, 6 images]
[im 46/136  bone]
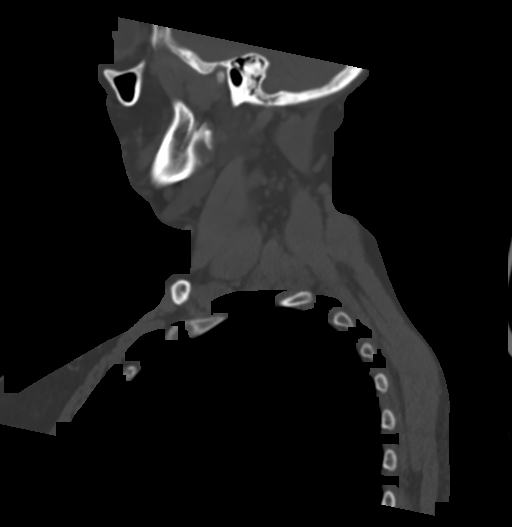
[im 57/136  bone]
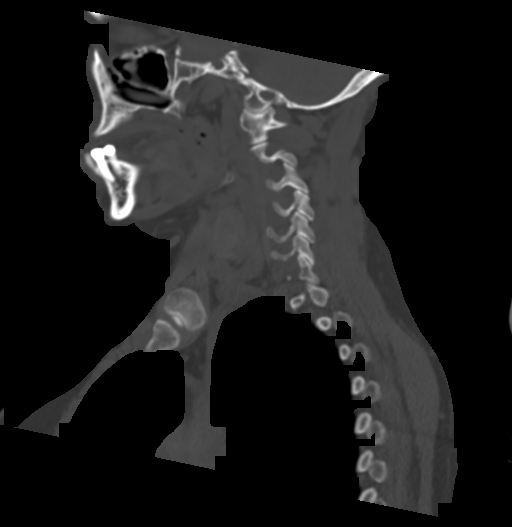
[im 68/136  soft-tissue]
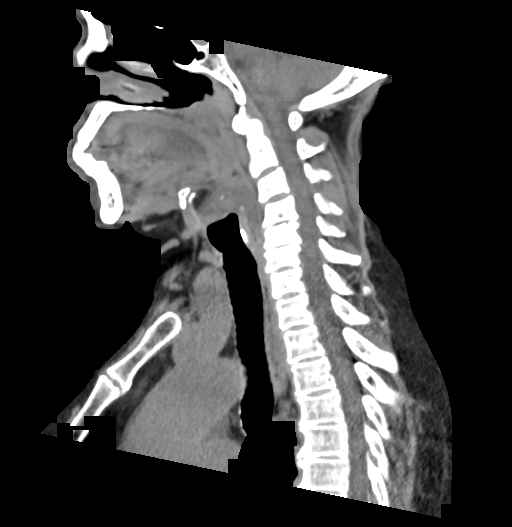
[im 68/136  bone]
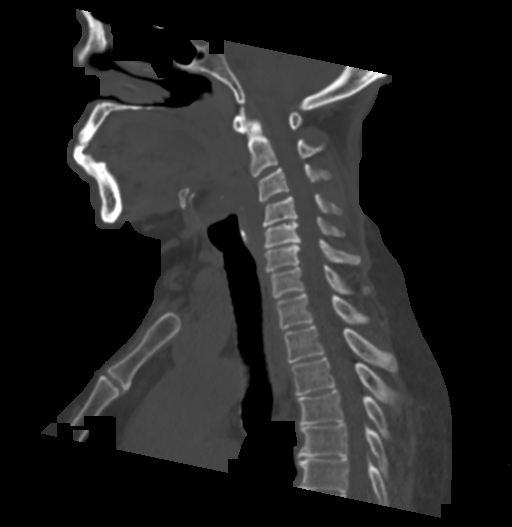
[im 79/136  bone]
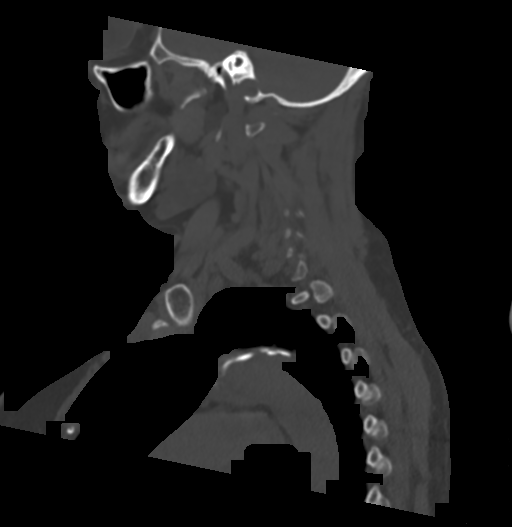
[im 91/136  bone]
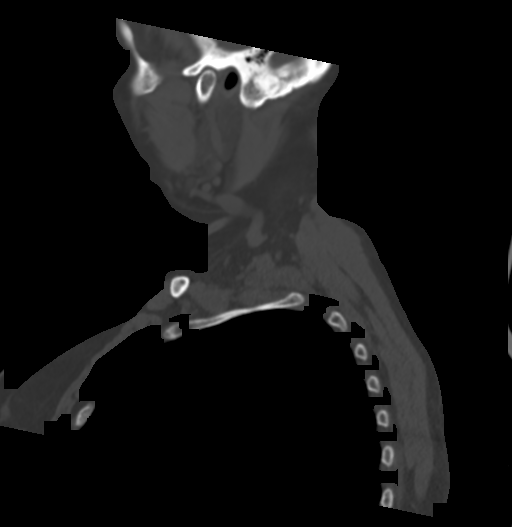

[Series 8: ax oropharynx neck · axial · 0.52mm/px · z∈[-735,-604]mm · 3 of 135 slices shown, 4 images]
[im 34/135  soft-tissue]
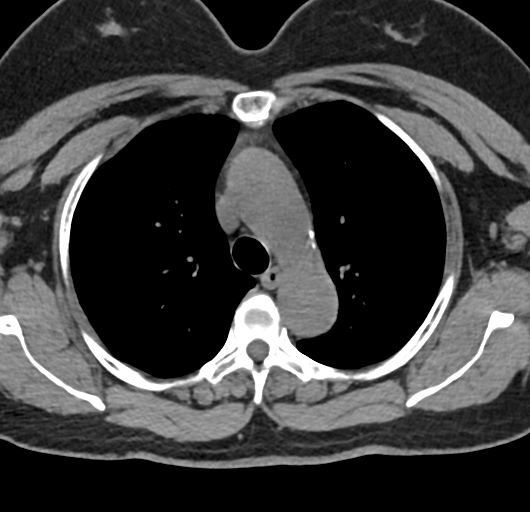
[im 34/135  bone]
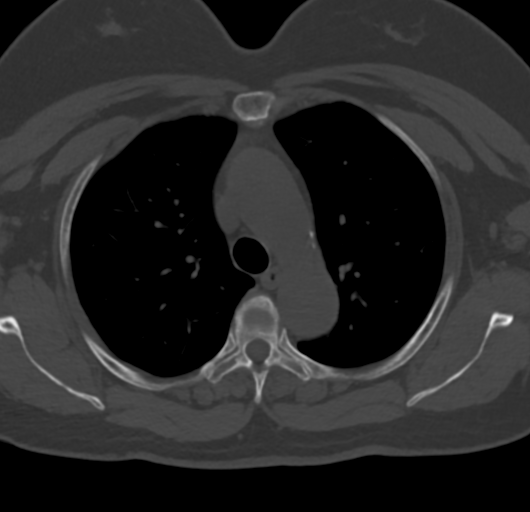
[im 68/135  bone]
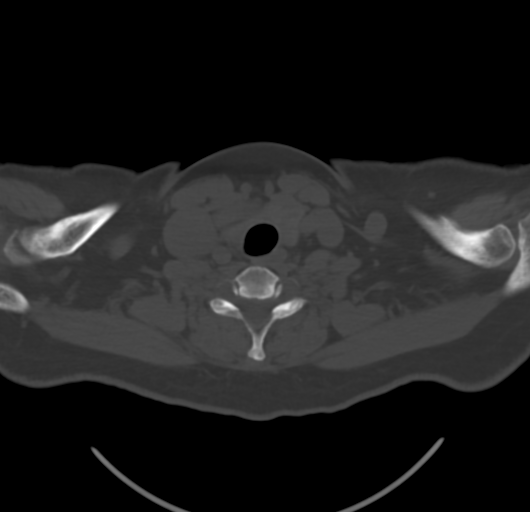
[im 101/135  bone]
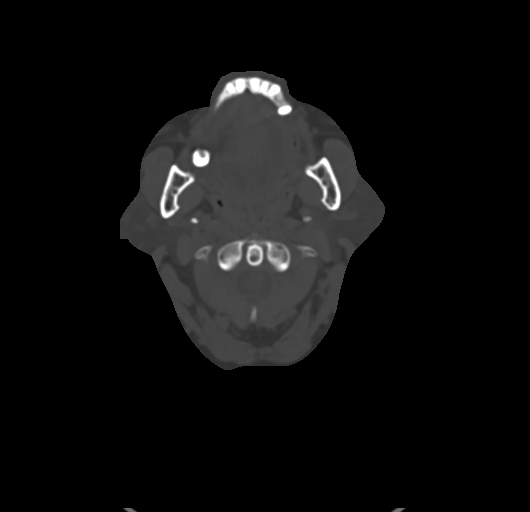

[16 of 33 positions shown; findings below may reference images not displayed]

FINDINGS: Pharynx and larynx: No focal mucosal or submucosal lesions are
present. Nasopharynx is clear. Oropharynx is collapsed. Glottis is
closed. No discrete lesions are present in the hypopharynx. Trachea
is within normal limits. The patient appears to be holding her
breath.

Salivary glands: The parotid glands are moderately prominent
bilaterally. There is slight asymmetry on the left with increased
density. No discrete lesion is present. The tail of the left parotid
gland is marked. There is no duct dilation.

Submandibular glands and ducts are within normal limits bilaterally.

Thyroid: Thyroid is mildly heterogeneous without a dominant lesion.

Lymph nodes: No significant cervical adenopathy is present.

Vascular: No significant atherosclerotic calcifications are vascular
lesions are noted.

Limited intracranial: Within normal limits.

Visualized orbits: The globes and orbits are within normal limits.

Mastoids and visualized paranasal sinuses: Mild mucosal thickening
is present in the inferior frontal sinuses. The paranasal sinuses
and mastoid air cells are otherwise clear.

Skeleton: There is straightening and some reversal of the normal
cervical lordosis. Endplate degenerative changes are most noted at
C4-5 and C5-6. No focal lytic or blastic lesions are present.

Upper chest: Lung apices are clear. Thoracic inlet is within normal
limits. Atherosclerotic calcifications are present within the aortic
arch. There is no aneurysm or significant calcification the origins
the great vessels.
IMPRESSION: 1. Mild asymmetry of the left parotid gland without a discrete
lesion or duct obstruction. This suggests a nonspecific parotitis.
2. Submandibular glands are normal bilaterally.
3. No significant adenopathy.
4.  Aortic Atherosclerosis ([id number].P).

## 2021-07-21 ENCOUNTER — Ambulatory Visit: Payer: Medicaid Other | Admitting: Gastroenterology

## 2021-08-08 ENCOUNTER — Encounter: Payer: Self-pay | Admitting: Gastroenterology

## 2021-08-08 ENCOUNTER — Other Ambulatory Visit: Payer: Self-pay

## 2021-08-08 ENCOUNTER — Encounter (INDEPENDENT_AMBULATORY_CARE_PROVIDER_SITE_OTHER): Payer: Self-pay | Admitting: Gastroenterology

## 2021-08-08 NOTE — Progress Notes (Signed)
error 

## 2021-10-15 ENCOUNTER — Other Ambulatory Visit: Payer: Self-pay

## 2021-10-15 ENCOUNTER — Emergency Department: Payer: Medicare Other

## 2021-10-15 ENCOUNTER — Observation Stay
Admission: EM | Admit: 2021-10-15 | Discharge: 2021-10-16 | Disposition: A | Discharge status: Home / Self Care | Payer: Medicare Other | Attending: Internal Medicine | Admitting: Internal Medicine

## 2021-10-15 DIAGNOSIS — Z20822 Contact with and (suspected) exposure to covid-19: Secondary | ICD-10-CM | POA: Insufficient documentation

## 2021-10-15 DIAGNOSIS — Z7984 Long term (current) use of oral hypoglycemic drugs: Secondary | ICD-10-CM | POA: Diagnosis not present

## 2021-10-15 DIAGNOSIS — E042 Nontoxic multinodular goiter: Secondary | ICD-10-CM | POA: Insufficient documentation

## 2021-10-15 DIAGNOSIS — R0789 Other chest pain: Secondary | ICD-10-CM | POA: Diagnosis present

## 2021-10-15 DIAGNOSIS — K219 Gastro-esophageal reflux disease without esophagitis: Secondary | ICD-10-CM | POA: Insufficient documentation

## 2021-10-15 DIAGNOSIS — R079 Chest pain, unspecified: Secondary | ICD-10-CM | POA: Diagnosis present

## 2021-10-15 DIAGNOSIS — E785 Hyperlipidemia, unspecified: Secondary | ICD-10-CM | POA: Diagnosis not present

## 2021-10-15 DIAGNOSIS — I10 Essential (primary) hypertension: Secondary | ICD-10-CM | POA: Diagnosis not present

## 2021-10-15 DIAGNOSIS — Z79899 Other long term (current) drug therapy: Secondary | ICD-10-CM | POA: Insufficient documentation

## 2021-10-15 LAB — CBC
HCT: 40.1 % (ref 36.0–46.0)
Hemoglobin: 12.9 g/dL (ref 12.0–15.0)
MCH: 27.3 pg (ref 26.0–34.0)
MCHC: 32.2 g/dL (ref 30.0–36.0)
MCV: 84.8 fL (ref 80.0–100.0)
Platelets: 320 10*3/uL (ref 150–400)
RBC: 4.73 MIL/uL (ref 3.87–5.11)
RDW: 13.9 % (ref 11.5–15.5)
WBC: 9.1 10*3/uL (ref 4.0–10.5)
nRBC: 0 % (ref 0.0–0.2)

## 2021-10-15 LAB — TROPONIN I (HIGH SENSITIVITY): Troponin I (High Sensitivity): 10 ng/L (ref <18)

## 2021-10-15 LAB — BASIC METABOLIC PANEL
Anion gap: 8 (ref 5–15)
BUN: 23 mg/dL (ref 8–23)
CO2: 24 mmol/L (ref 22–32)
Calcium: 9.2 mg/dL (ref 8.9–10.3)
Chloride: 106 mmol/L (ref 98–111)
Creatinine, Ser: 0.94 mg/dL (ref 0.44–1.00)
GFR, Estimated: >60 mL/min (ref >60)
Glucose, Bld: 136 mg/dL — ABNORMAL HIGH (ref 70–99)
Potassium: 3.3 mmol/L — ABNORMAL LOW (ref 3.5–5.1)
Sodium: 138 mmol/L (ref 135–145)

## 2021-10-15 MED ORDER — HEPARIN BOLUS VIA INFUSION
3000.0000 [IU] | Freq: Once | INTRAVENOUS | Status: AC
  Administered 2021-10-15: 3000 [IU] via INTRAVENOUS
  Filled 2021-10-15: qty 3000

## 2021-10-15 MED ORDER — MORPHINE SULFATE (PF) 2 MG/ML IV SOLN
2.0000 mg | Freq: Once | INTRAVENOUS | Status: DC
  Filled 2021-10-15: qty 1

## 2021-10-15 MED ORDER — HEPARIN (PORCINE) 25000 UT/250ML-% IV SOLN
10.0000 [IU]/kg/h | INTRAVENOUS | Status: DC
Start: 2021-10-15 — End: 2021-10-15

## 2021-10-15 MED ORDER — HEPARIN SODIUM (PORCINE) 5000 UNIT/ML IJ SOLN
60.0000 [IU]/kg | Freq: Once | INTRAMUSCULAR | Status: DC
Start: 2021-10-15 — End: 2021-10-15

## 2021-10-15 MED ORDER — HEPARIN (PORCINE) 25000 UT/250ML-% IV SOLN
750.0000 [IU]/h | INTRAVENOUS | Status: DC
  Administered 2021-10-15: 650 [IU]/h via INTRAVENOUS
  Filled 2021-10-15: qty 250

## 2021-10-15 MED ORDER — ONDANSETRON HCL 4 MG/2ML IJ SOLN
4.0000 mg | Freq: Once | INTRAMUSCULAR | Status: DC
  Filled 2021-10-15: qty 2

## 2021-10-15 MED ORDER — NITROGLYCERIN 2 % TD OINT
1.0000 [in_us] | TOPICAL_OINTMENT | Freq: Once | TRANSDERMAL | Status: AC
  Administered 2021-10-15: 1 [in_us] via TOPICAL
  Filled 2021-10-15: qty 1

## 2021-10-15 MED ORDER — HEPARIN BOLUS VIA INFUSION
3300.0000 [IU] | Freq: Once | INTRAVENOUS | Status: DC
  Filled 2021-10-15: qty 3300

## 2021-10-15 MED ORDER — SODIUM CHLORIDE 0.9 % IV SOLN: Freq: Once | INTRAVENOUS | Status: DC

## 2021-10-15 NOTE — ED Triage Notes (Signed)
Per EMS- pt was in bed and had sudden onset of left sided CP that is worse with inspiration. Pt was hypertensive w/ BP of 218/122. Pt was gvn 324 of aspirin and SL nitro en route bringing BP to 186/100 with decreased CP. Pt is AOX4, appears uncomfortable.

## 2021-10-15 NOTE — ED Notes (Signed)
Swab sent to lab at this time.

## 2021-10-15 NOTE — ED Provider Notes (Addendum)
[facility] Provider Note    Event Date/Time   First MD Initiated Contact with Patient 10/15/21 2238     (approximate)   History   Chest Pain   HPI  GENI SKORUPSKI [name] is a 66 y.o. female with a history of hypertension who developed sudden onset of chest pain with shortness of breath at home today.  She was not active at the time.  She reports the chest pain got worse with deep breathing.  EMS reports the chest pain got better with sublingual nitro.  They also gave her aspirin.      Physical Exam   Triage Vital Signs: ED Triage Vitals  Enc Vitals Group     BP 10/15/21 2233 (!) 176/93     Pulse Rate 10/15/21 2229 98     Resp 10/15/21 2229 19     Temp 10/15/21 2236 98.5 F (36.9 C)     Temp Source 10/15/21 2236 Oral     SpO2 10/15/21 2229 97 %     Weight --      Height --      Head Circumference --      Peak Flow --      Pain Score 10/15/21 2229 6     Pain Loc --      Pain Edu? --      Excl. in [place]? --     Most recent vital signs: Vitals:   10/15/21 2236 10/15/21 2300  BP:  (!) 181/144  Pulse:    Resp:  15  Temp: 98.5 F (36.9 C)   SpO2:       General: Awake, alert, uncomfortable CV:  Good peripheral perfusion.  Heart regular rate and rhythm I do not hear a murmur EMS reported 1 however. Resp:  Normal effort.  Lungs are clear patient reports it hurts to take a deep breath Abd:  No distention.  Abdomen is soft bowel sounds are positive Extremities no edema   ED Results / Procedures / Treatments   Labs (all labs ordered are listed, but only abnormal results are displayed) Labs Reviewed  BASIC METABOLIC PANEL - Abnormal; Notable for the following components:      Result Value   Potassium 3.3 (*)    Glucose, Bld 136 (*)    All other components within normal limits  CBC  COMPREHENSIVE METABOLIC PANEL  CBC WITH DIFFERENTIAL/PLATELET  PROTIME-INR  APTT  D-DIMER, QUANTITATIVE  TROPONIN I (HIGH SENSITIVITY)  TROPONIN I (HIGH  SENSITIVITY)     EKG  EKG read interpreted by me EKG read interpreted by me shows normal sinus rhythm at a rate of 100 normal axis diffusely flattened T waves with some T wave inversion in V4 and V5 with ST segment depression in V4. EMS EKG read interpreted by me shows sinus rhythm at about 75 normal axis also with flattened T waves ST segment depression in V5 with T wave inversion really looks similar to the other when accounting for lead placement. Old EKG showed flipped T waves in V6.  These are not currently present. RADIOLOGY  Chest x-ray read by radiology reviewed by me shows heart that is upper limits of normal but otherwise negative.  PROCEDURES:  Critical Care performed:   Procedures   MEDICATIONS ORDERED IN ED: Medications  0.9 %  sodium chloride infusion (has no administration in time range)  morphine (PF) 2 MG/ML injection 2 mg (2 mg Intravenous Patient Refused/Not Given 10/15/21 2333)  ondansetron (ZOFRAN) injection 4  mg (4 mg Intravenous Patient Refused/Not Given 10/15/21 2333)  heparin ADULT infusion 100 units/mL (25000 units/284mL) (650 Units/hr Intravenous New Bag/Given 10/15/21 2331)  nitroGLYCERIN (NITROGLYN) 2 % ointment 1 inch (1 inch Topical Given 10/15/21 2321)  heparin bolus via infusion 3,000 Units (3,000 Units Intravenous Bolus from Bag 10/15/21 2332)     IMPRESSION / MDM / ASSESSMENT AND PLAN / ED COURSE  I reviewed the triage vital signs and the nursing notes. Patient's pain improved somewhat with nitro be via EMS.  Initial troponin was negative.  D-dimer and second troponin are still pending.  Chest x-ray did not show anything suspicious.  Patient's symptoms are worrisome for something going on with her heart or lungs.  Could be PE or MI.  She has a history of a dilated aorta but pain is not going through to the back it is more central chest pain with some pleuritic component. Patient's blood pressures come down somewhat with the nitro.  It is about 40 points  lower systolically than he the initial reported high blood pressure.  For right now I will leave it there.  We will try to reduce it more later.  Possibly labetalol might help.  I am going to sign this patient out to the oncoming physician.  I anticipate this patient will need admission.  I have started her on Nitropaste and aspirin and heparin because of the T wave inversions in V4 and 5 which are somewhat different from the previous EKGs.       FINAL CLINICAL IMPRESSION(S) / ED DIAGNOSES   Final diagnoses:  Chest pain, unspecified type     Rx / DC Orders   ED Discharge Orders     None        Note:  This document was prepared using Dragon voice recognition software and may include unintentional dictation errors.   Nena Polio, MD 10/15/21 2354    Nena Polio, MD 10/15/21 [phone]

## 2021-10-15 NOTE — Progress Notes (Signed)
ANTICOAGULATION CONSULT NOTE  Pharmacy Consult for heparin infusion Indication: ACS/STEMI  Allergies  Allergen Reactions   Cortisone     Dizziness     Patient Measurements: Height: 4\' 9"  (144.8 cm) Weight: 54.9 kg (121 lb) IBW/kg (Calculated) : 38.6 Heparin Dosing Weight: 50.2 kg  Vital Signs: Temp: 98.5 F (36.9 C) (02/11 2236) Temp Source: Oral (02/11 2236) BP: 176/93 (02/11 2233) Pulse Rate: 98 (02/11 2229)  Labs: Recent Labs    10/15/21 2234  HGB 12.9  HCT 40.1  PLT 320    CrCl cannot be calculated (Patient's most recent lab result is older than the maximum 21 days allowed.).   Medical History: Past Medical History:  Diagnosis Date   Dysrhythmia    History of adenomatous polyp of colon    Hypertension    Multiple thyroid nodules     Assessment: Pt is 66 yo female presenting to ED via EMS after sudden onset of L-sided CP, started on heparin while ruling out ACS.  Goal of Therapy:  Heparin level 0.3-0.7 units/ml Monitor platelets by anticoagulation protocol: Yes   Plan:  Bolus 3000 units x 1 Start heparin infusion at 650 units/hr Check HL in 6 hr after start of infusion CBC daily while on heparin  Renda Rolls, PharmD, [facility] 10/15/2021 10:59 PM

## 2021-10-16 DIAGNOSIS — I10 Essential (primary) hypertension: Secondary | ICD-10-CM | POA: Diagnosis not present

## 2021-10-16 DIAGNOSIS — E785 Hyperlipidemia, unspecified: Secondary | ICD-10-CM | POA: Diagnosis not present

## 2021-10-16 DIAGNOSIS — R0789 Other chest pain: Secondary | ICD-10-CM | POA: Diagnosis not present

## 2021-10-16 DIAGNOSIS — R079 Chest pain, unspecified: Secondary | ICD-10-CM | POA: Diagnosis present

## 2021-10-16 DIAGNOSIS — K219 Gastro-esophageal reflux disease without esophagitis: Secondary | ICD-10-CM

## 2021-10-16 LAB — HEPARIN LEVEL (UNFRACTIONATED): Heparin Unfractionated: 0.29 IU/mL — ABNORMAL LOW (ref 0.30–0.70)

## 2021-10-16 LAB — COMPREHENSIVE METABOLIC PANEL
ALT: 22 U/L (ref 0–44)
AST: 26 U/L (ref 15–41)
Albumin: 3.9 g/dL (ref 3.5–5.0)
Alkaline Phosphatase: 63 U/L (ref 38–126)
Anion gap: 7 (ref 5–15)
BUN: 23 mg/dL (ref 8–23)
CO2: 25 mmol/L (ref 22–32)
Calcium: 8.6 mg/dL — ABNORMAL LOW (ref 8.9–10.3)
Chloride: 105 mmol/L (ref 98–111)
Creatinine, Ser: 0.6 mg/dL (ref 0.44–1.00)
GFR, Estimated: >60 mL/min (ref >60)
Glucose, Bld: 119 mg/dL — ABNORMAL HIGH (ref 70–99)
Potassium: 3.2 mmol/L — ABNORMAL LOW (ref 3.5–5.1)
Sodium: 137 mmol/L (ref 135–145)
Total Bilirubin: 1.3 mg/dL — ABNORMAL HIGH (ref 0.3–1.2)
Total Protein: 7.1 g/dL (ref 6.5–8.1)

## 2021-10-16 LAB — CBC WITH DIFFERENTIAL/PLATELET
Abs Immature Granulocytes: 0.01 10*3/uL (ref 0.00–0.07)
Basophils Absolute: 0.1 10*3/uL (ref 0.0–0.1)
Basophils Relative: 1 %
Eosinophils Absolute: 0.6 10*3/uL — ABNORMAL HIGH (ref 0.0–0.5)
Eosinophils Relative: 8 %
HCT: 38.2 % (ref 36.0–46.0)
Hemoglobin: 12.4 g/dL (ref 12.0–15.0)
Immature Granulocytes: 0 %
Lymphocytes Relative: 39 %
Lymphs Abs: 3.1 10*3/uL (ref 0.7–4.0)
MCH: 27.3 pg (ref 26.0–34.0)
MCHC: 32.5 g/dL (ref 30.0–36.0)
MCV: 84 fL (ref 80.0–100.0)
Monocytes Absolute: 0.6 10*3/uL (ref 0.1–1.0)
Monocytes Relative: 8 %
Neutro Abs: 3.4 10*3/uL (ref 1.7–7.7)
Neutrophils Relative %: 44 %
Platelets: 315 10*3/uL (ref 150–400)
RBC: 4.55 MIL/uL (ref 3.87–5.11)
RDW: 14 % (ref 11.5–15.5)
WBC: 7.8 10*3/uL (ref 4.0–10.5)
nRBC: 0 % (ref 0.0–0.2)

## 2021-10-16 LAB — LIPID PANEL
Cholesterol: 218 mg/dL — ABNORMAL HIGH (ref 0–200)
HDL: 64 mg/dL (ref >40)
LDL Cholesterol: 142 mg/dL — ABNORMAL HIGH (ref 0–99)
Total CHOL/HDL Ratio: 3.4 RATIO
Triglycerides: 59 mg/dL (ref <150)
VLDL: 12 mg/dL (ref 0–40)

## 2021-10-16 LAB — TROPONIN I (HIGH SENSITIVITY)
Troponin I (High Sensitivity): 9 ng/L (ref <18)
Troponin I (High Sensitivity): 6 ng/L (ref <18)

## 2021-10-16 LAB — MAGNESIUM: Magnesium: 2.3 mg/dL (ref 1.7–2.4)

## 2021-10-16 LAB — APTT: aPTT: 30 seconds (ref 24–36)

## 2021-10-16 LAB — RESP PANEL BY RT-PCR (FLU A&B, COVID) ARPGX2
Influenza A by PCR: NEGATIVE
Influenza B by PCR: NEGATIVE
SARS Coronavirus 2 by RT PCR: NEGATIVE

## 2021-10-16 LAB — D-DIMER, QUANTITATIVE: D-Dimer, Quant: 0.45 ug/mL-FEU (ref 0.00–0.50)

## 2021-10-16 LAB — PROTIME-INR
INR: 0.9 (ref 0.8–1.2)
Prothrombin Time: 12.4 seconds (ref 11.4–15.2)

## 2021-10-16 MED ORDER — ACETAMINOPHEN 325 MG PO TABS: 650.0000 mg | ORAL_TABLET | ORAL | Status: DC | PRN

## 2021-10-16 MED ORDER — ONDANSETRON HCL 4 MG/2ML IJ SOLN
4.0000 mg | Freq: Four times a day (QID) | INTRAMUSCULAR | Status: DC | PRN
  Administered 2021-10-16: 4 mg via INTRAVENOUS
  Filled 2021-10-16: qty 2

## 2021-10-16 MED ORDER — TRAZODONE HCL 50 MG PO TABS: 25.0000 mg | ORAL_TABLET | Freq: Every evening | ORAL | Status: DC | PRN

## 2021-10-16 MED ORDER — NITROGLYCERIN 0.4 MG SL SUBL
0.4000 mg | SUBLINGUAL_TABLET | SUBLINGUAL | Status: DC | PRN
  Administered 2021-10-16: 0.4 mg via SUBLINGUAL
  Filled 2021-10-16: qty 1

## 2021-10-16 MED ORDER — POTASSIUM CHLORIDE CRYS ER 20 MEQ PO TBCR
40.0000 meq | EXTENDED_RELEASE_TABLET | Freq: Once | ORAL | Status: AC
  Administered 2021-10-16: 40 meq via ORAL
  Filled 2021-10-16: qty 2

## 2021-10-16 MED ORDER — HEPARIN BOLUS VIA INFUSION
750.0000 [IU] | Freq: Once | INTRAVENOUS | Status: AC
  Administered 2021-10-16: 750 [IU] via INTRAVENOUS
  Filled 2021-10-16: qty 750

## 2021-10-16 MED ORDER — AMLODIPINE BESYLATE 5 MG PO TABS
10.0000 mg | ORAL_TABLET | Freq: Every day | ORAL | Status: DC
  Administered 2021-10-16: 10 mg via ORAL
  Filled 2021-10-16: qty 2

## 2021-10-16 MED ORDER — ALUM & MAG HYDROXIDE-SIMETH 200-200-20 MG/5ML PO SUSP
30.0000 mL | Freq: Once | ORAL | Status: AC
  Administered 2021-10-16: 30 mL via ORAL
  Filled 2021-10-16: qty 30

## 2021-10-16 MED ORDER — SODIUM CHLORIDE 0.9 % IV SOLN: INTRAVENOUS | Status: DC

## 2021-10-16 MED ORDER — MORPHINE SULFATE (PF) 2 MG/ML IV SOLN: 2.0000 mg | INTRAVENOUS | Status: DC | PRN

## 2021-10-16 MED ORDER — ROSUVASTATIN CALCIUM 20 MG PO TABS
20.0000 mg | ORAL_TABLET | Freq: Every day | ORAL | Status: DC
  Administered 2021-10-16: 20 mg via ORAL
  Filled 2021-10-16: qty 1

## 2021-10-16 MED ORDER — LIDOCAINE VISCOUS HCL 2 % MT SOLN
15.0000 mL | Freq: Once | OROMUCOSAL | Status: DC
  Filled 2021-10-16: qty 15

## 2021-10-16 MED ORDER — ALUM & MAG HYDROXIDE-SIMETH 200-200-20 MG/5ML PO SUSP
30.0000 mL | Freq: Once | ORAL | Status: DC
  Filled 2021-10-16: qty 30

## 2021-10-16 MED ORDER — ALPRAZOLAM 0.25 MG PO TABS: 0.2500 mg | ORAL_TABLET | Freq: Two times a day (BID) | ORAL | Status: DC | PRN

## 2021-10-16 NOTE — Progress Notes (Addendum)
ANTICOAGULATION CONSULT NOTE  Pharmacy Consult for heparin infusion Indication: ACS/STEMI  Allergies  Allergen Reactions   Cortisone     Dizziness     Patient Measurements: Height: 4\' 9"  (144.8 cm) Weight: 54.9 kg (121 lb) IBW/kg (Calculated) : 38.6 Heparin Dosing Weight: 50.2 kg  Vital Signs: Temp: 98 F (36.7 C) (02/12 0242) Temp Source: Oral (02/12 0242) BP: 144/82 (02/12 0630) Pulse Rate: 79 (02/12 0630)  Labs: Recent Labs    10/15/21 2234 10/16/21 0235 10/16/21 0637  HGB 12.9 12.4  --   HCT 40.1 38.2  --   PLT 320 315  --   APTT 30  --   --   LABPROT 12.4  --   --   INR 0.9  --   --   HEPARINUNFRC  --   --  0.29*  CREATININE 0.94 0.60  --   TROPONINIHS 10 9 6      Estimated Creatinine Clearance: 49.9 mL/min (by C-G formula based on SCr of 0.6 mg/dL).   Medical History: Past Medical History:  Diagnosis Date   Dysrhythmia    History of adenomatous polyp of colon    Hypertension    Multiple thyroid nodules     Assessment: Pt is 66 yo female presenting to ED via EMS after sudden onset of L-sided CP, started on heparin while ruling out ACS.  Goal of Therapy:  Heparin level 0.3-0.7 units/ml Monitor platelets by anticoagulation protocol: Yes   Plan:  2/12@0637 : HL 0.29, slightly subtherapeutic Bolus 750 units x 1 Increase heparin infusion to 750 units/hr Check HL in 6 hr after rate change CBC daily while on heparin  Pearla Dubonnet, PharmD Clinical Pharmacist 10/16/2021 7:30 AM

## 2021-10-16 NOTE — ED Provider Notes (Signed)
No pain at this time. First troponin is negative. D-dimer negative. Patient on heparin, plan to admit per Dr. Cinda Quest for abnormal EKG.   Rudene Re, MD 10/16/21 [phone]

## 2021-10-16 NOTE — H&P (Addendum)
[place]   PATIENT NAME: Jamie Trevino    MR#:  [id number]  DATE OF BIRTH:  04-Jun-1956  DATE OF ADMISSION:  10/15/2021  PRIMARY CARE PHYSICIAN: [facility], [place]   Patient is coming from: Home  REQUESTING/REFERRING PHYSICIAN: Alfred Levins, [place], MD  CHIEF COMPLAINT:   Chief Complaint  Patient presents with   Chest Pain    HISTORY OF PRESENT ILLNESS:  Jamie Trevino is a 66 y.o. Hispanic female with medical history significant for essential hypertension and GERD, who presented to the ER with acute onset of midsternal chest pain graded 10/10 in severity and described as pressure with associated dyspnea without nausea or vomiting or diaphoresis.  She felt numbness in the lips.  No cough or wheezing or hemoptysis.  No bleeding diathesis.  No dysuria, oliguria or hematuria or flank pain.  ED Course: When she came to the ER, BP was 176/93 with otherwise normal vital signs revealed hypokalemia 3.23 with normal CBC. EKG as reviewed by me : EKG revealed inferolateral ST segment depression slightly worsening from before with sinus rhythm with a rate of 100 and diffusely flat T waves.. Imaging: Portable chest ray showed no acute cardiopulmonary disease.  The patient was given an inch of Nitropaste, 2 mg of IV morphine sulfate and was loaded with IV heparin bolus followed by drip.  He will be admitted to cardiac telemetry observation bed for further evaluation and management. Past Medical History:  Diagnosis Date   Dysrhythmia    History of adenomatous polyp of colon    Hypertension    Multiple thyroid nodules     PAST SURGICAL HISTORY:   Past Surgical History:  Procedure Laterality Date   ABDOMINAL HYSTERECTOMY     adenomatous colon polyps  09/14/2016   COLONOSCOPY N/A 12/22/2016   Procedure: COLONOSCOPY;  Surgeon: Manya Silvas, MD;  Location: [facility];  Service: Endoscopy;  Laterality: N/A;   COLONOSCOPY WITH PROPOFOL N/A 01/14/2021    Procedure: COLONOSCOPY WITH PROPOFOL;  Surgeon: Lesly Rubenstein, MD;  Location: [facility];  Service: Endoscopy;  Laterality: N/A;  SPANISH INTERPRETER   TUBAL LIGATION      SOCIAL HISTORY:   Social History   Tobacco Use   Smoking status: Never   Smokeless tobacco: Never  Substance Use Topics   Alcohol use: No    FAMILY HISTORY:   Family History  Problem Relation Age of Onset   Prostate cancer Father    Breast cancer Neg Hx     DRUG ALLERGIES:   Allergies  Allergen Reactions   Cortisone     Dizziness     REVIEW OF SYSTEMS:   ROS As per history of present illness. All pertinent systems were reviewed above. Constitutional, HEENT, cardiovascular, respiratory, GI, GU, musculoskeletal, neuro, psychiatric, endocrine, integumentary and hematologic systems were reviewed and are otherwise negative/unremarkable except for positive findings mentioned above in the HPI.   MEDICATIONS AT HOME:   Prior to Admission medications   Medication Sig Start Date End Date Taking? Authorizing Provider  amLODipine (NORVASC) 10 MG tablet Take 10 mg by mouth daily.   Yes [provider]  atorvastatin (LIPITOR) 40 MG tablet Take by mouth. Patient not taking: Reported on 10/15/2021    [provider]  hydrALAZINE (APRESOLINE) 25 MG tablet Take 25 mg by mouth 3 (three) times daily. Patient not taking: Reported on 10/15/2021    [provider]  hydrochlorothiazide (HYDRODIURIL) 25 MG tablet Take 25 mg  by mouth daily. Patient not taking: Reported on 10/15/2021 02/28/21   [provider]  losartan (COZAAR) 100 MG tablet Take 100 mg by mouth daily. Patient not taking: Reported on 10/15/2021    [provider]  metFORMIN (GLUCOPHAGE) 500 MG tablet Take 500 mg by mouth 2 (two) times daily. 06/28/21   [provider]  sucralfate (CARAFATE) 1 g tablet Take 1 g by mouth 4 (four) times daily. Patient not taking: Reported on 10/15/2021 03/11/21    [provider]      VITAL SIGNS:  Blood pressure 136/82, pulse 79, temperature 98 F (36.7 C), temperature source Oral, resp. rate 17, height 4\' 9"  (1.448 m), weight 54.9 kg, SpO2 96 %.  PHYSICAL EXAMINATION:  Physical Exam  GENERAL:  67 y.o.-year-old Hispanic female patient lying in the bed with no acute distress.  EYES: Pupils equal, round, reactive to light and accommodation. No scleral icterus. Extraocular muscles intact.  HEENT: Head atraumatic, normocephalic. Oropharynx and nasopharynx clear.  NECK:  Supple, no jugular venous distention. No thyroid enlargement, no tenderness.  LUNGS: Normal breath sounds bilaterally, no wheezing, rales,rhonchi or crepitation. No use of accessory muscles of respiration.  CARDIOVASCULAR: Regular rate and rhythm, S1, S2 normal. No murmurs, rubs, or gallops.  ABDOMEN: Soft, nondistended, nontender. Bowel sounds present. No organomegaly or mass.  EXTREMITIES: No pedal edema, cyanosis, or clubbing.  NEUROLOGIC: Cranial nerves II through XII are intact. Muscle strength 5/5 in all extremities. Sensation intact. Gait not checked.  PSYCHIATRIC: The patient is alert and oriented x 3.  Normal affect and good eye contact. SKIN: No obvious rash, lesion, or ulcer.   LABORATORY PANEL:   CBC Recent Labs  Lab 10/16/21 0235  WBC 7.8  HGB 12.4  HCT 38.2  PLT 315   ------------------------------------------------------------------------------------------------------------------  Chemistries  Recent Labs  Lab 10/16/21 0235  NA 137  K 3.2*  CL 105  CO2 25  GLUCOSE 119*  BUN 23  CREATININE 0.60  CALCIUM 8.6*  AST 26  ALT 22  ALKPHOS 63  BILITOT 1.3*   ------------------------------------------------------------------------------------------------------------------  Cardiac Enzymes No results for input(s): TROPONINI in the last 168  hours. ------------------------------------------------------------------------------------------------------------------  RADIOLOGY:  DG Chest Portable 1 View  Result Date: 10/15/2021 CLINICAL DATA:  Left chest pain EXAM: PORTABLE CHEST 1 VIEW COMPARISON:  10/22/2019 FINDINGS: Lungs are clear.  D No pleural effusion or pneumothorax. The heart is top-normal in size.  Thoracic aortic atherosclerosis. IMPRESSION: No evidence of acute cardiopulmonary disease. Electronically Signed   By: Julian Hy M.D.   On: 10/15/2021 23:19      IMPRESSION AND PLAN:  Principal Problem:   Chest pain   1. Chest pain, rule out acute coronary syndrome. - The patient will be admitted to an observation telemetry bed. - Will follow serial cardiac enzymes and EKGs. - We will obtain a cardiology consult in a.m. for further cardiac risk stratification. - The patient will be placed on aspirin as well as p.r.n. sublingual nitroglycerin and morphine sulfate for pain. - She was started on IV heparin in the ER.  2.  Essential hypertension, currently uncontrolled. - We will continue Norvasc, HCTZ and Cozaar. - We will place on as needed IV labetalol.  3.  Dyslipidemia. - We will continue statin therapy.  4.  GERD. - Continue Carafate.  DVT prophylaxis: IV heparin. Advanced Care Planning:  Code Status: full code. Family Communication:  The plan of care was discussed in details with the patient (and family). I answered all questions. The  patient agreed to proceed with the above mentioned plan. Further management will depend upon hospital course. Disposition Plan: Back to previous home environment Consults called: Cardiology.  All the records are reviewed and case discussed with ED provider.  Status is: Observation Let I certify that at the time of admission, it is my clinical judgment that the patient will require inpatient hospital care extending less than 2 midnights.                            Dispo:  The patient is from: Home              Anticipated d/c is to: Home              Patient currently is not medically stable to d/c.              Difficult to place patient: No   Christel Mormon M.D on 10/16/2021 at 3:44 AM  [facility]   From 7 PM-7 AM, contact night-coverage [email or web address]  CC: Primary care physician; [facility], [place]

## 2021-10-16 NOTE — ED Notes (Signed)
Pt appears to bed sleeping at this time. Respirations even and unlabored. NAD noted.

## 2021-10-16 NOTE — Consult Note (Signed)
[facility] Cardiology  CARDIOLOGY CONSULT NOTE  Patient ID: Jamie Trevino MRN: [id number] DOB/AGE: 1956/03/08 66 y.o.  Admit date: 10/15/2021 Referring Physician Reesa Chew Primary Physician [place] Primary Cardiologist Fath Reason for Consultation chest pain  HPI: 66 year old Hispanic female referred for evaluation of chest pain.  Patient with her daughter who provides history.  Patient has a history of essential hypertension.  Presents to [facility] ED after 20-minute episode of substernal chest discomfort, described as sharp in nature.  ECG revealed sinus rhythm with nonspecific ST-T wave abnormalities laterally which appeared unchanged compared to prior ECG from 03/11/2021.  Upon arrival, patient was hypertensive with blood pressure 176/93.  He was treated with apical nitrates, heparin bolus and drip.  The patient has ruled out for myocardial infarction with normal high-sensitivity troponin (10, 9).  Patient had 1 recurrence of chest discomfort 5 AM.  She currently ports feeling better, and is chest pain-free.  Prior cardiac work-up includes 2D echocardiogram 12/26/2019 which revealed normal left ventricular function, with LVEF greater than 55%.  Cardiac MRI 06/04/2019 at [facility] revealed hyperdynamic LV systolic function with estimated LV ejection fraction of 80%, with bicuspid aortic valve without significant aortic stenosis or aortic regurgitation.  Review of systems complete and found to be negative unless listed above     Past Medical History:  Diagnosis Date   Dysrhythmia    History of adenomatous polyp of colon    Hypertension    Multiple thyroid nodules     Past Surgical History:  Procedure Laterality Date   ABDOMINAL HYSTERECTOMY     adenomatous colon polyps  09/14/2016   COLONOSCOPY N/A 12/22/2016   Procedure: COLONOSCOPY;  Surgeon: Manya Silvas, MD;  Location: [facility];  Service: Endoscopy;  Laterality: N/A;   COLONOSCOPY WITH PROPOFOL N/A  01/14/2021   Procedure: COLONOSCOPY WITH PROPOFOL;  Surgeon: Lesly Rubenstein, MD;  Location: [facility] ENDOSCOPY;  Service: Endoscopy;  Laterality: N/A;  SPANISH INTERPRETER   TUBAL LIGATION      (Not in a hospital admission)  Social History   Socioeconomic History   Marital status: Widowed    Spouse name: Not on file   Number of children: Not on file   Years of education: Not on file   Highest education level: Not on file  Occupational History   Not on file  Tobacco Use   Smoking status: Never   Smokeless tobacco: Never  Vaping Use   Vaping Use: Never used  Substance and Sexual Activity   Alcohol use: No   Drug use: No   Sexual activity: Not on file  Other Topics Concern   Not on file  Social History Narrative   Not on file   Social Determinants of Health   Financial Resource Strain: Not on file  Food Insecurity: Not on file  Transportation Needs: Not on file  Physical Activity: Not on file  Stress: Not on file  Social Connections: Not on file  Intimate Partner Violence: Not on file    Family History  Problem Relation Age of Onset   Prostate cancer Father    Breast cancer Neg Hx       Review of systems complete and found to be negative unless listed above      PHYSICAL EXAM  General: Well developed, well nourished, in no acute distress HEENT:  Normocephalic and atramatic Neck:  No JVD.  Lungs: Clear bilaterally to auscultation and percussion. Heart: HRRR . Normal S1 and S2 without gallops  or murmurs.  Abdomen: Bowel sounds are positive, abdomen soft and non-tender  Msk:  Back normal, normal gait. Normal strength and tone for age. Extremities: No clubbing, cyanosis or edema.   Neuro: Alert and oriented X 3. Psych:  Good affect, responds appropriately  Labs:   Lab Results  Component Value Date   WBC 7.8 10/16/2021   HGB 12.4 10/16/2021   HCT 38.2 10/16/2021   MCV 84.0 10/16/2021   PLT 315 10/16/2021    Recent Labs  Lab 10/16/21 0235  NA 137   K 3.2*  CL 105  CO2 25  BUN 23  CREATININE 0.60  CALCIUM 8.6*  PROT 7.1  BILITOT 1.3*  ALKPHOS 63  ALT 22  AST 26  GLUCOSE 119*   Lab Results  Component Value Date   TROPONINI <0.03 10/02/2017   No results found for: CHOL No results found for: HDL No results found for: LDLCALC No results found for: TRIG No results found for: CHOLHDL No results found for: LDLDIRECT    Radiology: DG Chest Portable 1 View  Result Date: 10/15/2021 CLINICAL DATA:  Left chest pain EXAM: PORTABLE CHEST 1 VIEW COMPARISON:  10/22/2019 FINDINGS: Lungs are clear.  D No pleural effusion or pneumothorax. The heart is top-normal in size.  Thoracic aortic atherosclerosis. IMPRESSION: No evidence of acute cardiopulmonary disease. Electronically Signed   By: Julian Hy M.D.   On: 10/15/2021 23:19    EKG: Sinus rhythm with nonspecific ST-T wave abnormalities laterally  ASSESSMENT AND PLAN:   1.  Chest pain, with atypical features, nondiagnostic ECG, normal high-sensitivity troponin (10, 9) 2.  Essential hypertension, pressure initially elevated, currently on amlodipine, hydralazine, HCTZ, and losartan 3.  Hyperlipidemia, on atorvastatin 4.  Type 2 diabetes, on metformin  Recommendations  1.  DC heparin 2.  Resume home BP medications 3.  If patient ambulates today, and does well consider discharge home for outpatient cardiac work-up 4.  Will tentatively schedule for Lexiscan Myoview in a.m.  Signed: Isaias Cowman MD,PhD, [facility] 10/16/2021, 9:43 AM

## 2021-10-16 NOTE — Discharge Summary (Signed)
Physician Discharge Summary   Patient: Jamie Trevino MRN: [id number] DOB: 03-09-1956  Admit date:     10/15/2021  Discharge date: 10/16/21  Discharge Physician: Lorella Nimrod   PCP: [facility], [place]   Recommendations at discharge:  Follow-up with PCP in 1 week Follow-up with cardiology in 1 week  Discharge Diagnoses: Principal Problem:   Chest pain   Hospital Course: Latorya Bautch is a 66 y.o. Hispanic female with medical history significant for essential hypertension and GERD, who presented to the ER with acute onset of midsternal chest pain graded 10/10 in severity and described as pressure with associated dyspnea without nausea or vomiting or diaphoresis.  On arrival to ED her blood pressure was elevated at 176/93, otherwise stable.  Labs only pertinent for mild hypokalemia which was repleted. EKG with inferolateral ST segment depression, pretty much similar findings on prior EKGs. Chest x-ray without any acute abnormality. Her troponin remains negative on multiple checks. Patient received Nitropaste, one-time dose of morphine and initially started on heparin infusion which was later discontinued after multiple negative troponin. Chest pain improved.  Cardiology was also consulted. Patient was able to walk around without any chest discomfort. Patient is being discharged home with advised to continue taking her blood pressure medications and she needs a close follow-up with cardiology and might get benefit from a University which can be arranged as an outpatient.  She will continue with rest of her home medications and follow-up with her providers.   Consultants: Cardiology Procedures performed: None Disposition: Home Diet recommendation:  Discharge Diet Orders (From admission, onward)     Start     Ordered   10/16/21 0000  Diet - low sodium heart healthy        10/16/21 1404           Cardiac and Carb modified diet  DISCHARGE  MEDICATION: Allergies as of 10/16/2021       Reactions   Cortisone    Dizziness        Medication List     STOP taking these medications    hydrALAZINE 25 MG tablet Commonly known as: APRESOLINE   hydrochlorothiazide 25 MG tablet Commonly known as: HYDRODIURIL   sucralfate 1 g tablet Commonly known as: CARAFATE       TAKE these medications    amLODipine 10 MG tablet Commonly known as: NORVASC Take 10 mg by mouth daily.   atorvastatin 40 MG tablet Commonly known as: LIPITOR Take by mouth.   losartan 100 MG tablet Commonly known as: COZAAR Take 100 mg by mouth daily.   metFORMIN 500 MG tablet Commonly known as: GLUCOPHAGE Take 500 mg by mouth 2 (two) times daily.        Follow-up [place], [place] Follow up in 1 week(s).   Specialty: General Practice Contact information: [place] [address] [phone]         Isaias Cowman, MD. Schedule an appointment as soon as possible for a visit in 1 week(s).   Specialty: Cardiology Contact information: [facility] [place] [address] [phone]                 Discharge Exam: Danley Danker Weights   10/15/21 2249  Weight: 54.9 kg   General.     In no acute distress. Pulmonary.  Lungs clear bilaterally, normal respiratory effort. CV.  Regular rate and rhythm, no JVD,  rub or murmur. Abdomen.  Soft, nontender, nondistended, BS positive. CNS.  Alert and oriented x3.  No focal neurologic deficit. Extremities.  No edema, no cyanosis, pulses intact and symmetrical. Psychiatry.  Judgment and insight appears normal.   Condition at discharge: stable  The results of significant diagnostics from this hospitalization (including imaging, microbiology, ancillary and laboratory) are listed below for reference.   Imaging Studies: DG Chest Portable 1 View  Result Date: 10/15/2021 CLINICAL  DATA:  Left chest pain EXAM: PORTABLE CHEST 1 VIEW COMPARISON:  10/22/2019 FINDINGS: Lungs are clear.  D No pleural effusion or pneumothorax. The heart is top-normal in size.  Thoracic aortic atherosclerosis. IMPRESSION: No evidence of acute cardiopulmonary disease. Electronically Signed   By: Julian Hy M.D.   On: 10/15/2021 23:19    Microbiology: Results for orders placed or performed during the hospital encounter of 10/15/21  Resp Panel by RT-PCR (Flu A&B, Covid) Nasopharyngeal Swab     Status: None   Collection Time: 10/15/21 10:34 PM   Specimen: Nasopharyngeal Swab; Nasopharyngeal(NP) swabs in vial transport medium  Result Value Ref Range Status   SARS Coronavirus 2 by RT PCR NEGATIVE NEGATIVE Final    Comment: (NOTE) SARS-CoV-2 target nucleic acids are NOT DETECTED.  The SARS-CoV-2 RNA is generally detectable in upper respiratory specimens during the acute phase of infection. The lowest concentration of SARS-CoV-2 viral copies this assay can detect is 138 copies/mL. A negative result does not preclude SARS-Cov-2 infection and should not be used as the sole basis for treatment or other patient management decisions. A negative result may occur with  improper specimen collection/handling, submission of specimen other than nasopharyngeal swab, presence of viral mutation(s) within the areas targeted by this assay, and inadequate number of viral copies(<138 copies/mL). A negative result must be combined with clinical observations, patient history, and epidemiological information. The expected result is Negative.  Fact Sheet for Patients:  EntrepreneurPulse.com.au  Fact Sheet for Healthcare Providers:  IncredibleEmployment.be  This test is no t yet approved or cleared by the [place] FDA and  has been authorized for detection and/or diagnosis of SARS-CoV-2 by FDA under an Emergency Use Authorization (EUA). This EUA will remain  in effect  (meaning this test can be used) for the duration of the COVID-19 declaration under Section 564(b)(1) of the Act, 21 U.S.C.section 360bbb-3(b)(1), unless the authorization is terminated  or revoked sooner.       Influenza A by PCR NEGATIVE NEGATIVE Final   Influenza B by PCR NEGATIVE NEGATIVE Final    Comment: (NOTE) The Xpert Xpress SARS-CoV-2/FLU/RSV plus assay is intended as an aid in the diagnosis of influenza from Nasopharyngeal swab specimens and should not be used as a sole basis for treatment. Nasal washings and aspirates are unacceptable for Xpert Xpress SARS-CoV-2/FLU/RSV testing.  Fact Sheet for Patients: EntrepreneurPulse.com.au  Fact Sheet for Healthcare Providers: IncredibleEmployment.be  This test is not yet approved or cleared by the [place] FDA and has been authorized for detection and/or diagnosis of SARS-CoV-2 by FDA under an Emergency Use Authorization (EUA). This EUA will remain in effect (meaning this test can be used) for the duration of the COVID-19 declaration under Section 564(b)(1) of the Act, 21 U.S.C. section 360bbb-3(b)(1), unless the authorization is terminated or revoked.  Performed at [facility], [address] [place], [place] [address]     Labs: CBC: Recent Labs  Lab 10/15/21 2234 10/16/21 0235  WBC 9.1 7.8  NEUTROABS  --  3.4  HGB 12.9  12.4  HCT 40.1 38.2  MCV 84.8 84.0  PLT 320 210   Basic Metabolic Panel: Recent Labs  Lab 10/15/21 2234 10/16/21 0235 10/16/21 0637  NA 138 137  --   K 3.3* 3.2*  --   CL 106 105  --   CO2 24 25  --   GLUCOSE 136* 119*  --   BUN 23 23  --   CREATININE 0.94 0.60  --   CALCIUM 9.2 8.6*  --   MG  --   --  2.3   Liver Function Tests: Recent Labs  Lab 10/16/21 0235  AST 26  ALT 22  ALKPHOS 63  BILITOT 1.3*  PROT 7.1  ALBUMIN 3.9   CBG: No results for input(s): GLUCAP in the last 168 hours.  Discharge time spent: greater than  30 minutes.  Signed: Lorella Nimrod, MD [facility] 10/16/2021

## 2021-10-18 LAB — HIV ANTIBODY (ROUTINE TESTING W REFLEX): HIV Screen 4th Generation wRfx: NONREACTIVE

## 2022-02-01 ENCOUNTER — Other Ambulatory Visit: Payer: Self-pay | Admitting: Family Medicine

## 2022-02-01 DIAGNOSIS — Z1231 Encounter for screening mammogram for malignant neoplasm of breast: Secondary | ICD-10-CM

## 2022-03-03 ENCOUNTER — Ambulatory Visit
Admission: RE | Admit: 2022-03-03 | Discharge: 2022-03-03 | Disposition: A | Discharge status: Home / Self Care | Payer: Medicare Other | Source: Ambulatory Visit | Attending: Family Medicine | Admitting: Family Medicine

## 2022-03-03 DIAGNOSIS — Z1231 Encounter for screening mammogram for malignant neoplasm of breast: Secondary | ICD-10-CM | POA: Insufficient documentation

## 2022-03-29 ENCOUNTER — Ambulatory Visit: Payer: Medicare Other | Attending: Family Medicine

## 2022-03-29 DIAGNOSIS — R42 Dizziness and giddiness: Secondary | ICD-10-CM | POA: Insufficient documentation

## 2022-04-03 NOTE — Therapy (Signed)
OUTPATIENT PHYSICAL THERAPY VESTIBULAR EVALUATION   Patient Name: Jamie Trevino MRN: [id number] DOB:04-Jul-1956, 66 y.o., female Today's Date: 04/05/2022  PCP: Denton Lank, MD REFERRING PROVIDER: Denton Lank, MD   PT End of Session - 04/05/22 [phone]     Visit Number 1    Number of Visits 9    Date for PT Re-Evaluation 05/31/22    Authorization Type eval: 04/05/22    PT Start Time 0850    PT Stop Time 0935    PT Time Calculation (min) 45 min    Activity Tolerance Patient tolerated treatment well    Behavior During Therapy [facility] for tasks assessed/performed             Past Medical History:  Diagnosis Date   Dysrhythmia    History of adenomatous polyp of colon    Hypertension    Multiple thyroid nodules    Past Surgical History:  Procedure Laterality Date   ABDOMINAL HYSTERECTOMY     adenomatous colon polyps  09/14/2016   COLONOSCOPY N/A 12/22/2016   Procedure: COLONOSCOPY;  Surgeon: Manya Silvas, MD;  Location: [facility] ENDOSCOPY;  Service: Endoscopy;  Laterality: N/A;   COLONOSCOPY WITH PROPOFOL N/A 01/14/2021   Procedure: COLONOSCOPY WITH PROPOFOL;  Surgeon: Lesly Rubenstein, MD;  Location: [facility] ENDOSCOPY;  Service: Endoscopy;  Laterality: N/A;  SPANISH INTERPRETER   TUBAL LIGATION     Patient Active Problem List   Diagnosis Date Noted   Chest pain 10/16/2021   Ascending aorta dilatation ([place]) 09/17/2020   Hematochezia 09/17/2020   Type 2 diabetes mellitus without complications ([place]) 09/32/3557   Hx of syphilis 02/26/2020   Dizziness 11/07/2019   HL (hearing loss) 11/07/2019   Benign paroxysmal positional vertigo 10/07/2018   Body mass index (BMI) 23.0-23.9, adult 09/17/2018   Dilatation of aorta ([place]) 09/17/2018   Insomnia, unspecified 03/09/2018   Patellofemoral pain syndrome of both knees 08/24/2017   Multiple thyroid nodules 11/17/2016   Ganglion, left hand 10/24/2016   Gastro-esophageal reflux disease without esophagitis 09/15/2016   H/O adenomatous  polyp of colon 09/14/2016   Chest pain of unknown etiology 01/10/2016   Essential hypertension 12/24/2015   Other abnormal glucose 12/09/2015   TMJ pain dysfunction syndrome 10/08/2013   Neck pain 09/24/2013   Thyroid enlarged 02/26/2013   Encounter for general adult medical examination without abnormal findings 01/24/2012   Benign neoplasm of colon, unspecified 11/14/2010   Menopausal syndrome 12/10/2009   Allergic rhinitis 01/02/2008   Other specified anxiety disorders 11/25/2007     PCP: Denton Lank, MD  REFERRING PROVIDER: Denton Lank, MD  REFERRING DIAGNOSIS: Dizziness and giddiness  THERAPY DIAG: Dizziness and giddiness  RATIONALE FOR EVALUATION AND TREATMENT: Rehabilitation  ONSET DATE: 03/05/22 (approximate)  FOLLOW UP APPT WITH PROVIDER: Yes    SUBJECTIVE:   Chief Complaint:  Vertigo  Pertinent History Pt arrives reporting recurrent vertigo which started approximately 1 month ago. However symptoms have improved significantly since the initial referral. At this point pt reports the symptoms are infrequent and very brief. She notices the symptoms when she bends forward or rolls over onto her L side in bed. 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 Last brain MRI was 03/31/21 and showed no acute intracranial abnormality. Age-related cerebral atrophy with mild to moderate chronic small vessel ischemic disease. Prior vestibular history from 08/19/2019: Patient reports dizziness for the past 3-4 years. Patient reports the episodes come and go, but lately she has been having more frequent and  worsening episodes. Patient describes her dizziness as vertigo and unsteadiness and pressure at the top of her head. Patient reports the vertigo lasts seconds to a few minutes. Patient reports she is getting episodes of vertigo more frequently in the last two months and she started to take Meclizine to help it go away. Patient reports that the Meclizine helps  her to not be so dizzy, but the bothersomeness in her head does not take that away. When asked to describe what bothersomeness means she denies it being headache, but rather a pressure on top of her head.Patient reports she has not had vertigo in the last week because she has been taking the Meclizine, but states she has had dizziness. Patient reports that in the mornings she feels her equilibrium is not so good and that she has to hold onto the walls for balance, which patient reports she has never had to do before. Patient reports bending over, looking up, quick movements, and when she is lying down the left ear causes dizziness. Patient reports the meclizine, alka seltzer and tried homeopathic things like ginger help reduce her symptoms. Patient reports that when she concentrates on a focal point, the vertigo will subside in seconds to a few minutes. 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 Patient states she had physical therapy last 22 days ago and she reports that therapy did not help this last time. Patient reports she saw ENT physician and and VNG was normal. Patient reports that she was told that it was not her ear this time. Patient reports she has a referred for neurology scheduled for January 2021.  Description of dizziness: vertigo Frequency: every few days Duration: a few seconds Symptom nature: positional Progression of symptoms since onset: better History of similar episodes: Yes, see above history  Provocative Factors: bending forward, quick position changes, rolling onto the L side Easing Factors: waiting for symptoms to pass  Auditory complaints (tinnitus, pain, drainage, hearing loss, aural fullness): Yes, tinnitus in L ear (chronic) Vision changes (diplopia, visual field loss, recent changes, recent eye exam): No, pt reports some blurred vision which is chronic. Wears reading  glasses. Chest pain/palpitations: Yes, occasional SOB/DOE and chest palpitations. Work up completed by MD History of head injury/concussion: No Stress/anxiety: Yes, not currently working which causes some stress. Plan is to return to work next week Focal weakness: No Numbness/tingling: bilateral hand numbness when not moving, occasional cramping in hands/feet  Has patient fallen in last 6 months? Yes, Number of falls: 2, no head injuries Pertinent pain: No Dominant hand: right Imaging: Yes  Prior level of function: Independent Occupational demands: housekeeper Hobbies:"clean and work"  [employer]: (dysarthria, dysphagia, drop attacks, recent weight loss/gain, fever, chills, night sweats) Review of systems negative for red flags.   PRECAUTIONS: None  WEIGHT BEARING RESTRICTIONS No  LIVING ENVIRONMENT: Lives with: lives with their family Lives in: House/apartment Stairs: Yes; External: 2 steps; none   PATIENT GOALS: Decrease symptoms   OBJECTIVE  EXAMINATION  POSTURE: No gross deficits contributing to symptoms  NEUROLOGICAL SCREEN: (2+ unless otherwise noted.) N=normal  Ab=abnormal  Level Dermatome R L Myotome R L Reflex R L  C3 Anterior Neck N N Sidebend C2-3 N N Jaw CN V    C4 Top of Shoulder N N Shoulder Shrug C4 N N Hoffman's UMN    C5 Lateral Upper Arm N N Shoulder ABD C4-5  N N Biceps C5-6    C6 Lateral Arm/ Thumb N N Arm Flex/ Wrist Ext C5-6 N N Brachiorad. C5-6    C7 Middle Finger N N Arm Ext//Wrist Flex C6-7 N N Triceps C7    C8 4th & 5th Finger N N Flex/ Ext Carpi Ulnaris C8 N N Patellar (L3-4)    T1 Medial Arm N N Interossei T1 N N Gastrocnemius    L2 Medial thigh/groin N N Illiopsoas (L2-3) N N     L3 Lower thigh/med.knee N N Quadriceps (L3-4) N N     L4 Medial leg/lat thigh N N Tibialis Ant (L4-5) N N     L5 Lat. leg & dorsal foot N N EHL (L5) N N     S1 post/lat foot/thigh/leg N N Gastrocnemius (S1-2) N N     S2 Post./med. thigh & leg N N Hamstrings  (L4-S3) N N       CRANIAL NERVES II, III, IV, VI: Pupils equal and reactive to light, visual acuity and visual fields are intact, extraocular muscles are intact  V: Facial sensation is intact and symmetric bilaterally  VII: Facial strength is intact and symmetric bilaterally  VIII: Hearing is normal as tested by gross conversation IX, X: Palate elevates midline, normal phonation, uvula midline XI: Shoulder shrug strength is intact  XII: Tongue protrudes midline    SOMATOSENSORY Grossly intact to light touch bilateral UEs/LEs as determined by testing dermatomes C2-T2 and L2-S2. Proprioception and hot/cold testing deferred on this date.   COORDINATION Deferred    RANGE OF MOTION Cervical Spine AROM WFL in all planes. Pt reports some mild neck pain in all positions s/p fall yesterday. No focal deficits in AROM noted in BUE/BLE   MANUAL MUSCLE TESTING BUE/BLE strength WNL without focal deficits with the exception of some bilateral grip weakness;   TRANSFERS/GAIT Independent for transfers and ambulation without assistive device    PATIENT SURVEYS FOTO: 48, predicted improvement to 62    POSTURAL CONTROL TESTS  Clinical Test of Sensory Interaction for Balance (CTSIB): Deferred   OCULOMOTOR / VESTIBULAR TESTING  Oculomotor Exam- Room Light  Findings Comments  Ocular Alignment normal   Ocular ROM normal   Spontaneous Nystagmus normal   Gaze-Holding Nystagmus normal   End-Gaze Nystagmus normal   Vergence (normal 2-3") not examined   Smooth Pursuit normal   Cross-Cover Test not examined   Saccades normal   VOR Cancellation normal   Left Head Impulse normal   Right Head Impulse normal   Static Acuity not examined   Dynamic Acuity not examined     Oculomotor Exam- Fixation Suppressed: Deferred   BPPV TESTS:  Symptoms Duration Intensity Nystagmus  L Dix-Hallpike Mild dizziness   None  R Dix-Hallpike Mild dizziness   None  L Head Roll None   None  R Head Roll  None   None  L Sidelying Test      R Sidelying Test      (blank = not tested)   FUNCTIONAL OUTCOME MEASURES: Deferred   TODAY'S TREATMENT   Canalith Repositioning Treatment Pt treated with 1 bout of Epley Maneuver for 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 One minute holds in each position.  After treatment education provided to daughter with handout about how to perform Home Epley Maneuver.   HOME EXERCISES Access Code: [email or web address] URL: [email or web address] Date: 04/05/2022 Prepared by: Roxana Hires  Exercises - Self-Epley Maneuver Left Ear  - 1 x daily - 7 x weekly - 3 reps -  1 minuto en cada posicion hold    ASSESSMENT:  CLINICAL IMPRESSION: Patient is a 66 y.o. female who was seen today for physical therapy evaluation and treatment for vertigo. Objective impairments include dizziness with position changes. Pt report dizziness in both right and left Dix-Hallpike positions, worse on the left. No nystagmus observed. Given subjective history with prior incidence of L sided BPPV pt treated today with L Epley Maneuver. Pt and daughter provided education regarding how to complete at home. At the time of PT evaluation symptoms have almost completely resolved. 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 Personal factors including Past/current experiences, Time since onset of injury/illness/exacerbation, and 1 comorbidity: neck pain  are also affecting patient's functional outcome. Patient will benefit from skilled PT to address above impairments and improve overall function.  REHAB POTENTIAL: Excellent  CLINICAL DECISION MAKING: Stable/uncomplicated  EVALUATION COMPLEXITY: Low   GOALS: Goals reviewed with patient? Yes  SHORT TERM GOALS: Target date: 05/03/2022  Pt will be independent with HEP for dizziness in order to decrease symptoms and improve function at home and work. Baseline: Goal status: INITIAL   LONG TERM GOALS: Target date:  05/31/2022  Pt will increase FOTO to at least 62 to demonstrate significant improvement in function at home related to dizziness.  Baseline: 04/05/22: 48 Goal status: INITIAL  2.  Pt will decrease DHI score by at least 18 points in order to demonstrate clinically significant reduction in disability related to dizziness.  Baseline: 04/05/22: To be completed Goal status: INITIAL  3.  Pt will report 100% improvement in symptoms in order to improve symptom-free function at home and work. Baseline:  Goal status: INITIAL    PLAN:  PT FREQUENCY: 1x/week  PT DURATION: 8 weeks  PLANNED INTERVENTIONS: Therapeutic exercises, Therapeutic activity, Neuromuscular re-education, Balance training, Gait training, Patient/Family education, Joint manipulation, Joint mobilization, Canalith repositioning, Aquatic Therapy, Dry Needling, Cognitive remediation, Electrical stimulation, Spinal manipulation, Spinal mobilization, Cryotherapy, Moist heat, Traction, Ultrasound, Ionotophoresis '4mg'$ /ml Dexamethasone, and Manual therapy  PLAN FOR NEXT SESSION: Coordination testing, fixation suppression testing, repeat Dix-Hallpike and CRT as indicated. Once clear perform balance screening as appropriate.   Lyndel Safe Yamaris Cummings PT, DPT, GCS  Ersel Enslin 04/05/2022, 10:21 AM

## 2022-04-05 ENCOUNTER — Ambulatory Visit: Payer: Medicare Other | Attending: Family Medicine

## 2022-04-05 DIAGNOSIS — R2681 Unsteadiness on feet: Secondary | ICD-10-CM | POA: Diagnosis present

## 2022-04-05 DIAGNOSIS — R42 Dizziness and giddiness: Secondary | ICD-10-CM | POA: Insufficient documentation

## 2022-04-19 NOTE — Therapy (Signed)
OUTPATIENT PHYSICAL THERAPY VESTIBULAR TREATMENT   Patient Name: Jamie Trevino MRN: [id number] DOB:1956-02-27, 66 y.o., female Today's Date: 04/21/2022  PCP: Denton Lank, MD REFERRING PROVIDER: Denton Lank, MD   PT End of Session - 04/21/22 0848     Visit Number 2    Number of Visits 9    Date for PT Re-Evaluation 05/31/22    Authorization Type eval: 04/05/22    PT Start Time 0850    PT Stop Time 0930    PT Time Calculation (min) 40 min    Activity Tolerance Patient tolerated treatment well    Behavior During Therapy [facility] for tasks assessed/performed              Past Medical History:  Diagnosis Date   Dysrhythmia    History of adenomatous polyp of colon    Hypertension    Multiple thyroid nodules    Past Surgical History:  Procedure Laterality Date   ABDOMINAL HYSTERECTOMY     adenomatous colon polyps  09/14/2016   COLONOSCOPY N/A 12/22/2016   Procedure: COLONOSCOPY;  Surgeon: Manya Silvas, MD;  Location: [facility] ENDOSCOPY;  Service: Endoscopy;  Laterality: N/A;   COLONOSCOPY WITH PROPOFOL N/A 01/14/2021   Procedure: COLONOSCOPY WITH PROPOFOL;  Surgeon: Lesly Rubenstein, MD;  Location: [facility] ENDOSCOPY;  Service: Endoscopy;  Laterality: N/A;  SPANISH INTERPRETER   TUBAL LIGATION     Patient Active Problem List   Diagnosis Date Noted   Chest pain 10/16/2021   Ascending aorta dilatation ([place]) 09/17/2020   Hematochezia 09/17/2020   Type 2 diabetes mellitus without complications ([place]) 12/87/8676   Hx of syphilis 02/26/2020   Dizziness 11/07/2019   HL (hearing loss) 11/07/2019   Benign paroxysmal positional vertigo 10/07/2018   Body mass index (BMI) 23.0-23.9, adult 09/17/2018   Dilatation of aorta ([place]) 09/17/2018   Insomnia, unspecified 03/09/2018   Patellofemoral pain syndrome of both knees 08/24/2017   Multiple thyroid nodules 11/17/2016   Ganglion, left hand 10/24/2016   Gastro-esophageal reflux disease without esophagitis 09/15/2016   H/O  adenomatous polyp of colon 09/14/2016   Chest pain of unknown etiology 01/10/2016   Essential hypertension 12/24/2015   Other abnormal glucose 12/09/2015   TMJ pain dysfunction syndrome 10/08/2013   Neck pain 09/24/2013   Thyroid enlarged 02/26/2013   Encounter for general adult medical examination without abnormal findings 01/24/2012   Benign neoplasm of colon, unspecified 11/14/2010   Menopausal syndrome 12/10/2009   Allergic rhinitis 01/02/2008   Other specified anxiety disorders 11/25/2007     PCP: Denton Lank, MD  REFERRING PROVIDER: Denton Lank, MD  REFERRING DIAGNOSIS: Dizziness and giddiness  THERAPY DIAG: Dizziness and giddiness  Unsteadiness on feet  RATIONALE FOR EVALUATION AND TREATMENT: Rehabilitation  ONSET DATE: 03/05/22 (approximate)  FOLLOW UP APPT WITH PROVIDER: Yes   FROM INITIAL EVALUATION  SUBJECTIVE:   Chief Complaint:  Vertigo  Pertinent History Pt arrives reporting recurrent vertigo which started approximately 1 month ago. However symptoms have improved significantly since the initial referral. At this point pt reports the symptoms are infrequent and very brief. She notices the symptoms when she bends forward or rolls over onto her L side in bed. 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 Last brain MRI was 03/31/21 and showed no acute intracranial abnormality. Age-related cerebral atrophy with mild to moderate chronic small vessel ischemic disease. Prior vestibular history from 08/19/2019: Patient reports dizziness for the past 3-4 years. Patient reports the episodes come and go, but  lately she has been having more frequent and worsening episodes. Patient describes her dizziness as vertigo and unsteadiness and pressure at the top of her head. Patient reports the vertigo lasts seconds to a few minutes. Patient reports she is getting episodes of vertigo more frequently in the last two months and she started to take Meclizine  to help it go away. Patient reports that the Meclizine helps her to not be so dizzy, but the bothersomeness in her head does not take that away. When asked to describe what bothersomeness means she denies it being headache, but rather a pressure on top of her head.Patient reports she has not had vertigo in the last week because she has been taking the Meclizine, but states she has had dizziness. Patient reports that in the mornings she feels her equilibrium is not so good and that she has to hold onto the walls for balance, which patient reports she has never had to do before. Patient reports bending over, looking up, quick movements, and when she is lying down the left ear causes dizziness. Patient reports the meclizine, alka seltzer and tried homeopathic things like ginger help reduce her symptoms. Patient reports that when she concentrates on a focal point, the vertigo will subside in seconds to a few minutes. 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 Patient states she had physical therapy last 22 days ago and she reports that therapy did not help this last time. Patient reports she saw ENT physician and and VNG was normal. Patient reports that she was told that it was not her ear this time. Patient reports she has a referred for neurology scheduled for January 2021.  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 Wears reading glasses. Chest pain/palpitations: Yes, occasional SOB/DOE and chest palpitations. Work up completed by MD History of head injury/concussion: No Stress/anxiety: Yes, not currently working which causes some stress. Plan is to return to work next week Focal weakness: No Numbness/tingling: bilateral hand numbness when not moving, occasional cramping in hands/feet  Has patient fallen in last 6 months? Yes, Number of falls: 2, no head injuries Pertinent pain: No Dominant hand: right Imaging: Yes  Prior level of function: Independent Occupational demands: housekeeper Hobbies:"clean and work"  [employer]: (dysarthria, dysphagia, drop attacks, recent weight loss/gain, fever, chills, night sweats) Review of systems negative for red flags.   PRECAUTIONS: None  WEIGHT BEARING RESTRICTIONS No  LIVING ENVIRONMENT: Lives with: lives with their family Lives in: House/apartment Stairs: Yes; External: 2 steps; none   PATIENT GOALS: Decrease symptoms   OBJECTIVE  EXAMINATION  POSTURE: No gross deficits contributing to symptoms  NEUROLOGICAL SCREEN: (2+ unless otherwise noted.) N=normal  Ab=abnormal  Level Dermatome R L Myotome R L Reflex R L  C3 Anterior Neck N N Sidebend C2-3 N N Jaw CN V    C4 Top of Shoulder N N Shoulder Shrug C4 N N Hoffman's UMN    C5  Lateral Upper Arm N N Shoulder ABD C4-5 N N Biceps C5-6    C6 Lateral Arm/ Thumb N N Arm Flex/ Wrist Ext C5-6 N N Brachiorad. C5-6    C7 Middle Finger N N Arm Ext//Wrist Flex C6-7 N N Triceps C7    C8 4th & 5th Finger N N Flex/ Ext Carpi Ulnaris C8 N N Patellar (L3-4)    T1 Medial Arm N N Interossei T1 N N Gastrocnemius    L2 Medial thigh/groin N N Illiopsoas (L2-3) N N     L3 Lower thigh/med.knee N N Quadriceps (L3-4) N N     L4 Medial leg/lat thigh N N Tibialis Ant (L4-5) N N     L5 Lat. leg & dorsal foot N N EHL (L5) N N     S1 post/lat foot/thigh/leg N N Gastrocnemius (S1-2) N N      S2 Post./med. thigh & leg N N Hamstrings (L4-S3) N N       CRANIAL NERVES II, III, IV, VI: Pupils equal and reactive to light, visual acuity and visual fields are intact, extraocular muscles are intact  V: Facial sensation is intact and symmetric bilaterally  VII: Facial strength is intact and symmetric bilaterally  VIII: Hearing is normal as tested by gross conversation IX, X: Palate elevates midline, normal phonation, uvula midline XI: Shoulder shrug strength is intact  XII: Tongue protrudes midline    SOMATOSENSORY Grossly intact to light touch bilateral UEs/LEs as determined by testing dermatomes C2-T2 and L2-S2. Proprioception and hot/cold testing deferred on this date.   COORDINATION Deferred    RANGE OF MOTION Cervical Spine AROM WFL in all planes. Pt reports some mild neck pain in all positions s/p fall yesterday. No focal deficits in AROM noted in BUE/BLE   MANUAL MUSCLE TESTING BUE/BLE strength WNL without focal deficits with the exception of some bilateral grip weakness;   TRANSFERS/GAIT Independent for transfers and ambulation without assistive device    PATIENT SURVEYS FOTO: 48, predicted improvement to 62    POSTURAL CONTROL TESTS  Clinical Test of Sensory Interaction for Balance (CTSIB): Deferred   OCULOMOTOR / VESTIBULAR TESTING  Oculomotor Exam- Room Light  Findings Comments  Ocular Alignment normal   Ocular ROM normal   Spontaneous Nystagmus normal   Gaze-Holding Nystagmus normal   End-Gaze Nystagmus normal   Vergence (normal 2-3") not examined   Smooth Pursuit normal   Cross-Cover Test not examined   Saccades normal   VOR Cancellation normal   Left Head Impulse normal   Right Head Impulse normal   Static Acuity not examined   Dynamic Acuity not examined     Oculomotor Exam- Fixation Suppressed: Deferred   BPPV TESTS:  Symptoms Duration Intensity Nystagmus  L Dix-Hallpike Mild dizziness   None  R Dix-Hallpike Mild dizziness    None  L Head Roll None   None  R Head Roll None   None  L Sidelying Test      R Sidelying Test      (blank = not tested)   FUNCTIONAL OUTCOME MEASURES: Deferred   TODAY'S TREATMENT    SUBJECTIVE: Pt reports that she is doing well today. She states that she feels like her symptoms are approximately 90% improved. She has only had a couple very brief episodes of vertigo when bending forward which lasted 1-2 seconds. No specific questions or concerns currently.   PAIN: Denies any pain related to this episodes. She is still having some L hand pain from her  injury.    Neuromuscular Re-education   BPPV TESTS:  Symptoms Duration Intensity Nystagmus  L Dix-Hallpike None   None  R Dix-Hallpike Mild dizziness 1-2s after 30s latency  None  L Head Roll None   None  R Head Roll None   None  L Sidelying Test None   None  R Sidelying Test None   None  Bow None 10s  Downbeating nystagmus  Lean None   None  (blank = not tested)   Supine: BP: 172/79 mmHg, HR: 61 bpm, SpO2: 99% Seated: BP: 167/95 mmHg, HR: 71 bpm, SpO2: 97% Standing (1 minute): BP: 178/87 mmHg, HR: 73 bpm, SpO2: 99% Standing (3 minutes): BP: 192/87 mmHg, HR: 72 bpm, SpO2: 99%   Oculomotor Exam- Fixation Suppressed  Findings Comments  Ocular Alignment normal   Spontaneous Nystagmus normal   Gaze-Holding Nystagmus normal   End-Gaze Nystagmus normal   Head Shaking Nystagmus abnormal Pure horizontal L beating nystagmus  Pressure-Induced Nystagmus not examined   Hyperventilation Induced Nystagmus not examined   Skull Vibration Induced Nystagmus not examined     Canalith Repositioning Treatment Repeated sidelying test bilaterally with infrared goggles. L side negative for vertigo and nystagmus. R side positive for a few beats of downbeating nystagmus but no dizziness. Pt treated with 1 bout of R [place] Manuever with one minute holds in each position.   PATIENT EDUCATION:  Education details: Plan of care Person  educated: Patient and daughter Education method: Explanation Education comprehension: verbalized understanding   HOME EXERCISES Access Code: [email or web address] URL: [email or web address] Date: 04/05/2022 Prepared by: Roxana Hires  Exercises - Self-Epley Maneuver Left Ear  - 1 x daily - 7 x weekly - 3 reps - 1 minuto en cada posicion hold    ASSESSMENT:  CLINICAL IMPRESSION: Dix-Hallpike testing (inverted mat table) is negative bilaterally with the exception of 1-2s of fleeting dizziness in R Dix-Hallpike position after approximately 30s latency. No nystagmus observed. Roll Test and Sidelying Test are both negative bilaterally for vertigo or nystagmus. Additional fixation suppression testing performed with infrared (IR) goggles and pt demonstrates a pure L horizontal beating nystagmus post-headshake consistent with possible UVH. Pt reports some dizziness when sitting up from supine position so orthostatic vitals obtained which are negative. Performed Bow and [employer] with IR goggles and during the bow maneuver pt has a downbeating nystagmus.  Repeated Sidelying Test with IR goggles which is negative on the left and positive on the right for downbeating nystagmus. Symptoms and nystagmus appear positional but do not occur consistently during testing. Pt treated with 1 bout of R Liberatory Manuever with one minute holds in each position. Will consider deep head hang maneuver during next appointment for possible anterior canal BPPV. Patient will benefit from skilled PT to address above impairments and improve overall function.  REHAB POTENTIAL: Excellent  CLINICAL DECISION MAKING: Stable/uncomplicated  EVALUATION COMPLEXITY: Low   GOALS: Goals reviewed with patient? Yes  SHORT TERM GOALS: Target date: 05/19/2022  Pt will be independent with HEP for dizziness in order to decrease symptoms and improve function at home and work. Baseline: Goal status: INITIAL   LONG TERM GOALS:  Target date: 06/16/2022  Pt will increase FOTO to at least 62 to demonstrate significant improvement in function at home related to dizziness.  Baseline: 04/05/22: 48 Goal status: INITIAL  2.  Pt will decrease DHI score by at least 18 points in order to demonstrate clinically significant reduction in disability related to dizziness.  Baseline: 04/05/22: To be completed  Goal status: INITIAL  3.  Pt will report 100% improvement in symptoms in order to improve symptom-free function at home and work. Baseline:  Goal status: INITIAL    PLAN:  PT FREQUENCY: 1x/week  PT DURATION: 8 weeks  PLANNED INTERVENTIONS: Therapeutic exercises, Therapeutic activity, Neuromuscular re-education, Balance training, Gait training, Patient/Family education, Joint manipulation, Joint mobilization, Canalith repositioning, Aquatic Therapy, Dry Needling, Cognitive remediation, Electrical stimulation, Spinal manipulation, Spinal mobilization, Cryotherapy, Moist heat, Traction, Ultrasound, Ionotophoresis '4mg'$ /ml Dexamethasone, and Manual therapy  PLAN FOR NEXT SESSION: Repeat Dix-Hallpike and CRT (consider deep head hang) as indicated. Once clear perform coordination and balance screening as appropriate.   Lyndel Safe Brixton Schnapp PT, DPT, GCS  Jamie Trevino 04/21/2022, 11:34 AM

## 2022-04-21 ENCOUNTER — Ambulatory Visit: Payer: Medicare Other

## 2022-04-21 DIAGNOSIS — R42 Dizziness and giddiness: Secondary | ICD-10-CM | POA: Diagnosis not present

## 2022-04-21 DIAGNOSIS — R2681 Unsteadiness on feet: Secondary | ICD-10-CM

## 2022-08-23 ENCOUNTER — Other Ambulatory Visit: Payer: Self-pay | Admitting: Internal Medicine

## 2022-08-23 DIAGNOSIS — I719 Aortic aneurysm of unspecified site, without rupture: Secondary | ICD-10-CM

## 2022-08-23 DIAGNOSIS — R0602 Shortness of breath: Secondary | ICD-10-CM

## 2022-09-15 ENCOUNTER — Ambulatory Visit
Admission: RE | Admit: 2022-09-15 | Discharge: 2022-09-15 | Disposition: A | Discharge status: Home / Self Care | Payer: Medicare Other | Source: Ambulatory Visit | Attending: Internal Medicine | Admitting: Internal Medicine

## 2022-09-15 DIAGNOSIS — R0602 Shortness of breath: Secondary | ICD-10-CM | POA: Diagnosis present

## 2022-09-15 DIAGNOSIS — I719 Aortic aneurysm of unspecified site, without rupture: Secondary | ICD-10-CM | POA: Insufficient documentation

## 2022-09-15 LAB — POCT I-STAT CREATININE: Creatinine, Ser: 0.5 mg/dL (ref 0.44–1.00)

## 2022-09-15 MED ORDER — IOHEXOL 350 MG/ML SOLN
75.0000 mL | Freq: Once | INTRAVENOUS | Status: AC | PRN
  Administered 2022-09-15: 75 mL via INTRAVENOUS

## 2023-03-22 ENCOUNTER — Other Ambulatory Visit: Payer: Self-pay | Admitting: Family Medicine

## 2023-03-22 DIAGNOSIS — Z1231 Encounter for screening mammogram for malignant neoplasm of breast: Secondary | ICD-10-CM

## 2023-05-31 ENCOUNTER — Other Ambulatory Visit: Payer: Self-pay | Admitting: Family Medicine

## 2023-05-31 DIAGNOSIS — N644 Mastodynia: Secondary | ICD-10-CM

## 2023-06-15 ENCOUNTER — Ambulatory Visit
Admission: RE | Admit: 2023-06-15 | Discharge: 2023-06-15 | Disposition: A | Discharge status: Home / Self Care | Payer: Medicare Other | Source: Ambulatory Visit | Attending: Family Medicine | Admitting: Family Medicine

## 2023-06-15 ENCOUNTER — Other Ambulatory Visit: Payer: Self-pay | Admitting: Otolaryngology

## 2023-06-15 DIAGNOSIS — H905 Unspecified sensorineural hearing loss: Secondary | ICD-10-CM

## 2023-06-15 DIAGNOSIS — N644 Mastodynia: Secondary | ICD-10-CM

## 2023-06-29 NOTE — Progress Notes (Unsigned)
Sleep Medicine   Office Visit  Patient Name: Jamie Trevino DOB: [birth year] MRN [id number]    Chief Complaint: sleep evaluation  Brief History:  Joleth presents for an initial consult for sleep evaluation and to establish care. Patient has a 10-15 year history of insomnia. Sleep quality is poor. This is noted most nights. The patient's bed partner reports  loud snoring at night. The patient relates the following symptoms: difficulty getting to and staying asleep, frequent awakenings and snoring are also present. The patient goes to sleep at 1 am and wakes up at 6 am.  Sleep quality is worse when outside home environment.  Patient has noted no restlessness of her legs at night that would disrupt her sleep.  The patient  relates no unusual behavior during the night.  The patient relates no history of psychiatric problems. The Epworth Sleepiness Score is 1 out of 24 .  The patient relates  Cardiovascular risk factors include: hypertension, dilatation of aorta, bicuspid aortic valve.  The patient reports excessive rumination which she has had problems with all her life. She reports lying in bed most nights with the tv on for hours because she cannot fall asleep.   ROS  General: (-) fever, (-) chills, (-) night sweat Nose and Sinuses: (-) nasal stuffiness or itchiness, (-) postnasal drip, (-) nosebleeds, (-) sinus trouble. Mouth and Throat: (-) sore throat, (-) hoarseness. Neck: (-) swollen glands, (-) enlarged thyroid, (-) neck pain. Respiratory: - cough, - shortness of breath, - wheezing. Neurologic: - numbness, - tingling. Psychiatric: - anxiety, - depression Sleep behavior: -sleep paralysis -hypnogogic hallucinations -dream enactment      -vivid dreams -cataplexy -night terrors -sleep walking   Current Medication: Outpatient Encounter Medications as of 07/02/2023  Medication Sig Note   amLODipine (NORVASC) 10 MG tablet Take 10 mg by mouth daily. 10/15/2021: LF 10/12/21    hydrochlorothiazide (HYDRODIURIL) 12.5 MG tablet Take 12.5 mg by mouth daily.    metoprolol succinate (TOPROL-XL) 50 MG 24 hr tablet Take 50 mg by mouth daily. Take with or immediately following a meal.    [DISCONTINUED] atorvastatin (LIPITOR) 40 MG tablet Take by mouth.    [DISCONTINUED] losartan (COZAAR) 100 MG tablet Take 100 mg by mouth daily.    [DISCONTINUED] metFORMIN (GLUCOPHAGE) 500 MG tablet Take 500 mg by mouth 2 (two) times daily. 10/15/2021: LF 06/28/21 for 30 day supply   No facility-administered encounter medications on file as of 07/02/2023.    Surgical History: Past Surgical History:  Procedure Laterality Date   ABDOMINAL HYSTERECTOMY     adenomatous colon polyps  09/14/2016   COLONOSCOPY N/A 12/22/2016   Procedure: COLONOSCOPY;  Surgeon: Scot Jun, MD;  Location: [facility] ENDOSCOPY;  Service: Endoscopy;  Laterality: N/A;   COLONOSCOPY WITH PROPOFOL N/A 01/14/2021   Procedure: COLONOSCOPY WITH PROPOFOL;  Surgeon: Regis Bill, MD;  Location: [facility] ENDOSCOPY;  Service: Endoscopy;  Laterality: N/A;  SPANISH INTERPRETER   TUBAL LIGATION      Medical History: Past Medical History:  Diagnosis Date   Dysrhythmia    History of adenomatous polyp of colon    Hypertension    Multiple thyroid nodules     Family History: Non contributory to the present illness  Social History: Social History   Socioeconomic History   Marital status: Widowed    Spouse name: Not on file   Number of children: Not on file   Years of education: Not on file   Highest education level: Not on file  Occupational History   Not on file  Tobacco Use   Smoking status: Never   Smokeless tobacco: Never  Vaping Use   Vaping status: Never Used  Substance and Sexual Activity   Alcohol use: No   Drug use: No   Sexual activity: Not on file  Other Topics Concern   Not on file  Social History Narrative   Not on file   Social Determinants of Health   Financial Resource Strain: Low Risk   (06/21/2023)   Received from [facility]   Overall Financial Resource Strain (CARDIA)    Difficulty of Paying Living Expenses: Not hard at all  Food Insecurity: No Food Insecurity (06/21/2023)   Received from [facility]   Hunger Vital Sign    Worried About Running Out of Food in the Last Year: Never true    Ran Out of Food in the Last Year: Never true  Transportation Needs: No Transportation Needs (06/21/2023)   Received from [facility] - Transportation    In the past 12 months, has lack of transportation kept you from medical appointments or from getting medications?: No    Lack of Transportation (Non-Medical): No  Physical Activity: Not on file  Stress: Not on file  Social Connections: Not on file  Intimate Partner Violence: Not on file    Vital Signs: Blood pressure (!) 172/80, pulse 76, resp. rate 14, height 5' (1.524 m), weight 128 lb (58.1 kg), SpO2 99%. Body mass index is 25 kg/m.   Examination: General Appearance: The patient is well-developed, well-nourished, and in no distress. Neck Circumference: 34 cm Skin: Gross inspection of skin unremarkable. Head: normocephalic, no gross deformities. Eyes: no gross deformities noted. ENT: ears appear grossly normal Neurologic: Alert and oriented. No involuntary movements.    STOP BANG RISK ASSESSMENT S (snore) Have you been told that you snore?     YES   T (tired) Are you often tired, fatigued, or sleepy during the day?   NO  O (obstruction) Do you stop breathing, choke, or gasp during sleep? NO   P (pressure) Do you have or are you being treated for high blood pressure? YES   B (BMI) Is your body index greater than 35 kg/m? NO   A (age) Are you 49 years old or older? YES   N (neck) Do you have a neck circumference greater than 16 inches?   NO   G (gender) Are you a female? NO   TOTAL STOP/BANG "YES" ANSWERS 3                                                                A STOP-Bang score of 2 or less is considered low risk, and a score of 5 or more is high risk for having either moderate or severe OSA. For people who score 3 or 4, doctors may need to perform further assessment to determine how likely they are to have OSA.         EPWORTH SLEEPINESS SCALE:  Scale:  (0)= no chance of dozing; (1)= slight chance of dozing; (2)= moderate chance of dozing; (3)= high chance of dozing  Chance  Situtation    Sitting and reading: 0    Watching  TV: 0    Sitting Inactive in public: 0    As a passenger in car: 1      Lying down to rest: 0    Sitting and talking: 0    Sitting quielty after lunch: 0    In a car, stopped in traffic: 0   TOTAL SCORE:   1 out of 24    SLEEP STUDIES:  None   LABS: No results found for this or any previous visit (from the past 2160 hour(s)).  Radiology: MM 3D DIAGNOSTIC MAMMOGRAM BILATERAL BREAST  Result Date: 06/15/2023 CLINICAL DATA:  67 year old female presenting for evaluation of new diffuse lateral breast pain. No associated lump. Patient is also due for annual exam of the right breast. EXAM: DIGITAL DIAGNOSTIC BILATERAL MAMMOGRAM WITH TOMOSYNTHESIS AND CAD TECHNIQUE: Bilateral digital diagnostic mammography and breast tomosynthesis was performed. The images were evaluated with computer-aided detection. COMPARISON:  Previous exam(s). ACR Breast Density Category c: The breasts are heterogeneously dense, which may obscure small masses. FINDINGS: Right breast: No suspicious mass, distortion, or microcalcifications are identified to suggest presence of malignancy. Left breast: No suspicious mass, distortion, or microcalcifications are identified to suggest presence of malignancy. Specifically there is no new finding in the lateral aspect of the left breast to explain the patient's new diffuse pain. IMPRESSION: No mammographic evidence of malignancy bilaterally or other finding to explain the  patient's diffuse lateral breast pain. RECOMMENDATION: 1. Clinical follow-up as needed for breast pain. Breast pain is a common condition, which will often resolve on its own without intervention. It can be affected by hormonal changes, medication side effect, weight changes and fit of the bra. Pain may also be referred from other adjacent areas of the body. Breast pain may be improved by wearing adequate well-fitting support, over-the-counter topical and oral NSAID medication, low-fat diet, and ice/heat as needed. Studies have shown an improvement in cyclic pain with use of evening primrose oil and vitamin E. 2.  Screening mammogram in one year.(Code:SM-B-01Y) I have discussed the findings and recommendations with the patient. If applicable, a reminder letter will be sent to the patient regarding the next appointment. BI-RADS CATEGORY  1: Negative. Electronically Signed   By: Emmaline Kluver M.D.   On: 06/15/2023 14:01    No results found.  MM 3D DIAGNOSTIC MAMMOGRAM BILATERAL BREAST  Result Date: 06/15/2023 CLINICAL DATA:  67 year old female presenting for evaluation of new diffuse lateral breast pain. No associated lump. Patient is also due for annual exam of the right breast. EXAM: DIGITAL DIAGNOSTIC BILATERAL MAMMOGRAM WITH TOMOSYNTHESIS AND CAD TECHNIQUE: Bilateral digital diagnostic mammography and breast tomosynthesis was performed. The images were evaluated with computer-aided detection. COMPARISON:  Previous exam(s). ACR Breast Density Category c: The breasts are heterogeneously dense, which may obscure small masses. FINDINGS: Right breast: No suspicious mass, distortion, or microcalcifications are identified to suggest presence of malignancy. Left breast: No suspicious mass, distortion, or microcalcifications are identified to suggest presence of malignancy. Specifically there is no new finding in the lateral aspect of the left breast to explain the patient's new diffuse pain. IMPRESSION: No  mammographic evidence of malignancy bilaterally or other finding to explain the patient's diffuse lateral breast pain. RECOMMENDATION: 1. Clinical follow-up as needed for breast pain. Breast pain is a common condition, which will often resolve on its own without intervention. It can be affected by hormonal changes, medication side effect, weight changes and fit of the bra. Pain may also be referred from other adjacent areas of the  body. Breast pain may be improved by wearing adequate well-fitting support, over-the-counter topical and oral NSAID medication, low-fat diet, and ice/heat as needed. Studies have shown an improvement in cyclic pain with use of evening primrose oil and vitamin E. 2.  Screening mammogram in one year.(Code:SM-B-01Y) I have discussed the findings and recommendations with the patient. If applicable, a reminder letter will be sent to the patient regarding the next appointment. BI-RADS CATEGORY  1: Negative. Electronically Signed   By: Emmaline Kluver M.D.   On: 06/15/2023 14:01      Assessment and Plan: Patient Active Problem List   Diagnosis Date Noted   Snoring 07/02/2023   Chest pain 10/16/2021   Ascending aorta dilatation (HCC) 09/17/2020   Hematochezia 09/17/2020   Type 2 diabetes mellitus without complications (HCC) 03/04/2020   Hx of syphilis 02/26/2020   Dizziness 11/07/2019   HL (hearing loss) 11/07/2019   Benign paroxysmal positional vertigo 10/07/2018   Body mass index (BMI) 23.0-23.9, adult 09/17/2018   Dilatation of aorta (HCC) 09/17/2018   Insomnia, unspecified 03/09/2018   Patellofemoral pain syndrome of both knees 08/24/2017   Multiple thyroid nodules 11/17/2016   Ganglion, left hand 10/24/2016   Gastro-esophageal reflux disease without esophagitis 09/15/2016   H/O adenomatous polyp of colon 09/14/2016   Chest pain of unknown etiology 01/10/2016   Essential hypertension 12/24/2015   Other abnormal glucose 12/09/2015   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 Snoring Patient evaluation suggests high risk of sleep disordered breathing due to snoring.  Patient has comorbid cardiovascular risk factors including: hypertension and bicuspid aortic valve which could be exacerbated by pathologic sleep-disordered breathing.  Suggest: PSG  to assess/treat the patient's sleep disordered breathing. The patient was also counselled on sleep hygiene  to optimize sleep health.  2. Insomnia, unspecified type Discussed sleep hygiene at length. Active thinking technique reviewed.  Methods to improve your sleep habits -Keep a consistent wind down period before your bedtime to help you relax. Do not do work-related activities around bedtime.  -Go to bed only when you are sleepy.  -Get out of bed at the same time every morning, even on holidays or weekends.  -If you haven't fallen asleep after lying in bed in 20 minutes, do something boring and relaxing with minimal light expo sure outside the bedroom. Return to bed when you are sleepy.  -While in bed, turn the clock away from you. It'll only remind you of something you probably don't want to know.  -No napping during the day, especially after 3 pm. If a nap is unavoidable, keep it under an hour.  -Your bedroom should be quiet and dark.  -Take a warm, relaxing bath or shower! A warm bath or shower prior to bedtime increasesyour body's temperature so the subsequent cooling off period can help induce sleep.  -Exercise regularly, but not tough exercise within 6 hours or bedtime.  -Avoid eating large or fatty meals near bedtime.  -Avoid caffeine after lunch.    1 - Portions taken from  http://yoursleep.HipsReplacement.fr.aspx           General Counseling: I have discussed the findings of the evaluation and examination with Konner.  I have also discussed any further diagnostic evaluation  thatmay be needed or ordered today. Levaeh verbalizes understanding of the findings of todays visit. We also reviewed her medications today and discussed drug interactions and side effects including but not limited excessive drowsiness and altered mental states. We also discussed that there is always a risk not just to her but also people around her. she has been encouraged to call the office with any questions or concerns that should arise related to todays visit.  No orders of the defined types were placed in this encounter.       I have personally obtained a history, evaluated the patient, evaluated pertinent data, formulated the assessment and plan and placed orders.   This patient was seen today by Emmaline Kluver, PA-C in collaboration with Dr. Freda Munro.   Yevonne Pax, MD [facility] Diplomate ABMS Pulmonary and Critical Care Medicine Sleep medicine

## 2023-07-02 ENCOUNTER — Ambulatory Visit (INDEPENDENT_AMBULATORY_CARE_PROVIDER_SITE_OTHER): Payer: Medicare Other | Admitting: Internal Medicine

## 2023-07-02 VITALS — BP 172/80 | HR 76 | Resp 14 | Ht 60.0 in | Wt 128.0 lb

## 2023-07-02 DIAGNOSIS — G4700 Insomnia, unspecified: Secondary | ICD-10-CM

## 2023-07-02 DIAGNOSIS — R0683 Snoring: Secondary | ICD-10-CM | POA: Insufficient documentation

## 2023-07-02 NOTE — Patient Instructions (Signed)
Insomnia Insomnia is a sleep disorder that makes it difficult to fall asleep or stay asleep. Insomnia can cause fatigue, low energy, difficulty concentrating, mood swings, and poor performance at work or school. There are three different ways to classify insomnia: Difficulty falling asleep. Difficulty staying asleep. Waking up too early in the morning. Any type of insomnia can be long-term (chronic) or short-term (acute). Both are common. Short-term insomnia usually lasts for 3 months or less. Chronic insomnia occurs at least three times a week for longer than 3 months. What are the causes? Insomnia may be caused by another condition, situation, or substance, such as: Having certain mental health conditions, such as anxiety and depression. Using caffeine, alcohol, tobacco, or drugs. Having gastrointestinal conditions, such as gastroesophageal reflux disease (GERD). Having certain medical conditions. These include: Asthma. Alzheimer's disease. Stroke. Chronic pain. An overactive thyroid gland (hyperthyroidism). Other sleep disorders, such as restless legs syndrome and sleep apnea. Menopause. Sometimes, the cause of insomnia may not be known. What increases the risk? Risk factors for insomnia include: Gender. Females are affected more often than males. Age. Insomnia is more common as people get older. Stress and certain medical and mental health conditions. Lack of exercise. Having an irregular work schedule. This may include working night shifts and traveling between different time zones. What are the signs or symptoms? If you have insomnia, the main symptom is having trouble falling asleep or having trouble staying asleep. This may lead to other symptoms, such as: Feeling tired or having low energy. Feeling nervous about going to sleep. Not feeling rested in the morning. Having trouble concentrating. Feeling irritable, anxious, or depressed. How is this diagnosed? This condition  may be diagnosed based on: Your symptoms and medical history. Your health care provider may ask about: Your sleep habits. Any medical conditions you have. Your mental health. A physical exam. How is this treated? Treatment for insomnia depends on the cause. Treatment may focus on treating an underlying condition that is causing the insomnia. Treatment may also include: Medicines to help you sleep. Counseling or therapy. Lifestyle adjustments to help you sleep better. Follow these instructions at home: Eating and drinking  Limit or avoid alcohol, caffeinated beverages, and products that contain nicotine and tobacco, especially close to bedtime. These can disrupt your sleep. Do not eat a large meal or eat spicy foods right before bedtime. This can lead to digestive discomfort that can make it hard for you to sleep. Sleep habits  Keep a sleep diary to help you and your health care provider figure out what could be causing your insomnia. Write down: When you sleep. When you wake up during the night. How well you sleep and how rested you feel the next day. Any side effects of medicines you are taking. What you eat and drink. Make your bedroom a dark, comfortable place where it is easy to fall asleep. Put up shades or blackout curtains to block light from outside. Use a white noise machine to block noise. Keep the temperature cool. Limit screen use before bedtime. This includes: Not watching TV. Not using your smartphone, tablet, or computer. Stick to a routine that includes going to bed and waking up at the same times every day and night. This can help you fall asleep faster. Consider making a quiet activity, such as reading, part of your nighttime routine. Try to avoid taking naps during the day so that you sleep better at night. Get out of bed if you are still awake after   15 minutes of trying to sleep. Keep the lights down, but try reading or doing a quiet activity. When you feel  sleepy, go back to bed. General instructions Take over-the-counter and prescription medicines only as told by your health care provider. Exercise regularly as told by your health care provider. However, avoid exercising in the hours right before bedtime. Use relaxation techniques to manage stress. Ask your health care provider to suggest some techniques that may work well for you. These may include: Breathing exercises. Routines to release muscle tension. Visualizing peaceful scenes. Make sure that you drive carefully. Do not drive if you feel very sleepy. Keep all follow-up visits. This is important. Contact a health care provider if: You are tired throughout the day. You have trouble in your daily routine due to sleepiness. You continue to have sleep problems, or your sleep problems get worse. Get help right away if: You have thoughts about hurting yourself or someone else. Get help right away if you feel like you may hurt yourself or others, or have thoughts about taking your own life. Go to your nearest emergency room or: Call 911. Call the National Suicide Prevention Lifeline at 1-800-273-8255 or 988. This is open 24 hours a day. Text the Crisis Text Line at 741741. Summary Insomnia is a sleep disorder that makes it difficult to fall asleep or stay asleep. Insomnia can be long-term (chronic) or short-term (acute). Treatment for insomnia depends on the cause. Treatment may focus on treating an underlying condition that is causing the insomnia. Keep a sleep diary to help you and your health care provider figure out what could be causing your insomnia. This information is not intended to replace advice given to you by your health care provider. Make sure you discuss any questions you have with your health care provider. Document Revised: 08/01/2021 Document Reviewed: 08/01/2021 Elsevier Patient Education  2024 [employer]  

## 2023-07-13 ENCOUNTER — Ambulatory Visit
Admission: RE | Admit: 2023-07-13 | Discharge: 2023-07-13 | Disposition: A | Discharge status: Home / Self Care | Payer: Medicare Other | Source: Ambulatory Visit | Attending: Otolaryngology | Admitting: Otolaryngology

## 2023-07-13 DIAGNOSIS — H905 Unspecified sensorineural hearing loss: Secondary | ICD-10-CM

## 2023-07-13 MED ORDER — GADOPICLENOL 0.5 MMOL/ML IV SOLN
7.5000 mL | Freq: Once | INTRAVENOUS | Status: AC | PRN
  Administered 2023-07-13: 6 mL via INTRAVENOUS

## 2024-03-26 ENCOUNTER — Other Ambulatory Visit: Payer: Self-pay | Admitting: Family Medicine

## 2024-03-26 DIAGNOSIS — R918 Other nonspecific abnormal finding of lung field: Secondary | ICD-10-CM

## 2024-04-04 ENCOUNTER — Ambulatory Visit

## 2024-04-11 ENCOUNTER — Ambulatory Visit
Admission: RE | Admit: 2024-04-11 | Discharge: 2024-04-11 | Disposition: A | Discharge status: Home / Self Care | Source: Ambulatory Visit | Attending: Family Medicine | Admitting: Family Medicine

## 2024-04-11 DIAGNOSIS — R918 Other nonspecific abnormal finding of lung field: Secondary | ICD-10-CM | POA: Diagnosis present

## 2024-08-20 ENCOUNTER — Ambulatory Visit: Attending: Family Medicine

## 2024-08-20 DIAGNOSIS — R42 Dizziness and giddiness: Secondary | ICD-10-CM | POA: Diagnosis present

## 2024-08-20 NOTE — Therapy (Signed)
 OUTPATIENT PHYSICAL THERAPY VESTIBULAR EVALUATION  Patient Name: Jamie Trevino MRN: [id number] DOB:Aug 14, 1956, 68 y.o., female Today's Date: 08/20/2024  END OF SESSION:  PT End of Session - 08/20/24 1548     Visit Number 1    Number of Visits 9    Date for Recertification  10/15/24    Authorization Type eval: 08/20/24    PT Start Time 1555    PT Stop Time 1615    PT Time Calculation (min) 20 min    Activity Tolerance Patient tolerated treatment well    Behavior During Therapy [facility] for tasks assessed/performed         Past Medical History:  Diagnosis Date   Dysrhythmia    History of adenomatous polyp of colon    Hypertension    Multiple thyroid nodules    Past Surgical History:  Procedure Laterality Date   ABDOMINAL HYSTERECTOMY     adenomatous colon polyps  09/14/2016   COLONOSCOPY N/A 12/22/2016   Procedure: COLONOSCOPY;  Surgeon: Lamar ONEIDA Holmes, MD;  Location: [facility] ENDOSCOPY;  Service: Endoscopy;  Laterality: N/A;   COLONOSCOPY WITH PROPOFOL  N/A 01/14/2021   Procedure: COLONOSCOPY WITH PROPOFOL ;  Surgeon: Maryruth Ole ONEIDA, MD;  Location: [facility] ENDOSCOPY;  Service: Endoscopy;  Laterality: N/A;  SPANISH INTERPRETER   TUBAL LIGATION     Patient Active Problem List   Diagnosis Date Noted   Snoring 07/02/2023   Chest pain 10/16/2021   Ascending aorta dilatation 09/17/2020   Hematochezia 09/17/2020   Type 2 diabetes mellitus without complications (HCC) 03/04/2020   Hx of syphilis 02/26/2020   Dizziness 11/07/2019   HL (hearing loss) 11/07/2019   Benign paroxysmal positional vertigo 10/07/2018   Body mass index (BMI) 23.0-23.9, adult 09/17/2018   Dilatation of aorta 09/17/2018   Insomnia, unspecified 03/09/2018   Patellofemoral pain syndrome of both knees 08/24/2017   Multiple thyroid nodules 11/17/2016   Ganglion, left hand 10/24/2016   Gastro-esophageal reflux disease without esophagitis 09/15/2016   H/O adenomatous polyp of colon 09/14/2016   Chest pain  of unknown etiology 01/10/2016   Essential hypertension 12/24/2015   Other abnormal glucose 12/09/2015   TMJ pain dysfunction syndrome 10/08/2013   Neck pain 09/24/2013   Thyroid enlarged 02/26/2013   Encounter for general adult medical examination without abnormal findings 01/24/2012   Benign neoplasm of colon, unspecified 11/14/2010   Menopausal syndrome 12/10/2009   Allergic rhinitis 01/02/2008   Other specified anxiety disorders 11/25/2007   PCP: Tobie Domino, MD  REFERRING PROVIDER: Ricard Tawni KIDD, MD   REFERRING DIAG: R42 (ICD-10-CM) - Dizziness and giddiness   RATIONALE FOR EVALUATION AND TREATMENT: Rehabilitation  THERAPY DIAG: Dizziness and giddiness  ONSET DATE: Two weeks ago  FOLLOW-UP APPT SCHEDULED WITH REFERRING PROVIDER: No    SUBJECTIVE:   Chief Complaint:  Dizziness  Pertinent History Pt reports dizziness/spinning when getting into and out of bed. Symptoms started two weeks ago. Pt is taking Dramamine for her symptoms but did not take yesterday or today. She is doing exercises from the internet which have helped some. Symptoms last for seconds and are always positional. No additional symptoms or health changes reported. 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  History from 04/05/22 Pt arrives reporting recurrent vertigo which started approximately 1 month ago. However symptoms have improved significantly since the initial referral. At this point pt reports the symptoms are infrequent and very brief. She notices the symptoms when she bends forward or rolls over onto her L  side in bed. 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 Last brain MRI was 03/31/21 and showed no acute intracranial abnormality. Age-related cerebral atrophy with mild to moderate chronic small vessel ischemic disease. Prior vestibular history from 08/19/2019: Patient reports dizziness for the past 3-4 years. Patient reports the episodes come  and go, but lately she has been having more frequent and worsening episodes. Patient describes her dizziness as vertigo and unsteadiness and pressure at the top of her head. Patient reports the vertigo lasts seconds to a few minutes. Patient reports she is getting episodes of vertigo more frequently in the last two months and she started to take Meclizine  to help it go away. Patient reports that the Meclizine  helps her to not be so dizzy, but the bothersomeness in her head does not take that away. When asked to describe what bothersomeness means she denies it being headache, but rather a pressure on top of her head.Patient reports she has not had vertigo in the last week because she has been taking the Meclizine , but states she has had dizziness. Patient reports that in the mornings she feels her equilibrium is not so good and that she has to hold onto the walls for balance, which patient reports she has never had to do before. Patient reports bending over, looking up, quick movements, and when she is lying down the left ear causes dizziness. Patient reports the meclizine , alka seltzer and tried homeopathic things like ginger help reduce her symptoms. Patient reports that when she concentrates on a focal point, the vertigo will subside in seconds to a few minutes. Patient reports years ago she fell and had physical therapy and has had therapy for the ears in the past and states she has had BPPV. Patient states she had physical therapy last 22 days ago and she reports that therapy did not help this last time. Patient reports she saw ENT physician and and VNG was normal. Patient reports that she was told that it was not her ear this time. Patient reports she has a referred for neurology scheduled for January 2021.  Description of dizziness: vertigo Frequency: occurs whenever she changes positions or looks up Duration: seconds Symptom nature: positional Progression of symptoms since onset: better but still  present History of similar episodes: Yes  Provocative Factors: laying down or getting up, looking up Easing Factors: dramamine  Auditory complaints (tinnitus, pain, drainage, hearing loss, aural fullness): Yes, L ear pain, tinnitus, denies hearing loss Vision changes (diplopia, visual field loss, recent changes, recent eye exam): No Chest pain/palpitations: Yes, heart racing during episodes History of head injury/concussion: No Stress/anxiety: No Migraines/headaches: No Nausea/vomiting: No Numbness/tingling: No Focal weakness: No Dysarthria/dysphagia/drop attacks: No  Has patient fallen in last 6 months? No Pertinent pain: No Dominant hand: right Imaging: No  Prior level of function: Independent  Red Flags: Negative for bowel/bladder changes, personal history of cancer, chills/fever, night sweats  PRECAUTIONS: None  WEIGHT BEARING RESTRICTIONS No  LIVING ENVIRONMENT: Lives with: lives with their family Lives in: House/apartment Pt reports some instability with walking  PATIENT GOALS Decrease symptoms   OBJECTIVE EXAMINATION  POSTURE: No gross deficits contributing to symptoms  NEUROLOGICAL SCREEN: (2+ unless otherwise noted.) N=normal  Ab=abnormal  Level Dermatome R L Myotome R L Reflex R L  C3 Anterior Neck N N Sidebend C2-3 N N Jaw CN V    C4 Top of Shoulder N N Shoulder Shrug C4 N N Hoffmans UMN    C5  Lateral Upper Arm N N Shoulder ABD C4-5 N N Biceps C5-6    C6 Lateral Arm/ Thumb N N Arm Flex/ Wrist Ext C5-6 N N Brachiorad. C5-6    C7 Middle Finger N N Arm Ext//Wrist Flex C6-7 N N Triceps C7    C8 4th & 5th Finger N N Flex/ Ext Carpi Ulnaris C8 N N Patellar (L3-4)    T1 Medial Arm N N Interossei T1 N N Gastrocnemius    L2 Medial thigh/groin N N Illiopsoas (L2-3) N N     L3 Lower thigh/med.knee N N Quadriceps (L3-4) N N     L4 Medial leg/lat thigh N N Tibialis Ant (L4-5) N N     L5 Lat. leg & dorsal foot N N EHL (L5) N N     S1 post/lat foot/thigh/leg N N  Gastrocnemius (S1-2) N N     S2 Post./med. thigh & leg N N Hamstrings (L4-S3) N N      CRANIAL NERVES II, III, IV, VI: Pupils equal and reactive to light, visual acuity and visual fields are intact, extraocular muscles are intact  V: Facial sensation is intact and symmetric bilaterally  VII: Facial strength is intact and symmetric bilaterally  VIII: Hearing is normal as tested by gross conversation IX, X: Palate elevates midline, normal phonation, uvula midline XI: Shoulder shrug strength is intact  XII: Tongue protrudes midline   COORDINATION Finger to Nose: Dysmetria bilaterally Heel to Shin: Normal Pronator Drift: Positive RUE Rapid Alternating Movements: Normal Finger to Thumb Opposition: Normal   RANGE OF MOTION Cervical Spine AROM WFL and painless in all planes. No functional focal deficits in AROM noted in BUE/BLE  MANUAL MUSCLE TESTING BUE/BLE strength WNL without focal deficits  TRANSFERS/GAIT Independent for transfers and ambulation without assistive device   PATIENT SURVEYS DHI: 32/100  OCULOMOTOR / VESTIBULAR TESTING  Oculomotor Exam- Room Light  Findings Comments  Ocular Alignment normal   Ocular ROM normal   Spontaneous Nystagmus normal   Gaze-Holding Nystagmus abnormal Possible L horizontal beating nystagmus at midrange with L gaze  End-Gaze Nystagmus abnormal See above  Vergence (normal 2-3) not examined   Smooth Pursuit abnormal Saccadic  Cross-Cover Test not examined   Saccades not examined   VOR Cancellation not examined   Left Head Impulse normal   Right Head Impulse abnormal Corrective Saccade  Static Acuity not examined   Dynamic Acuity not examined    Oculomotor Exam- Fixation Suppressed: DeferredBPPV TESTS:  Symptoms Duration Intensity Nystagmus  L Dix-Hallpike None   None  R Dix-Hallpike Vertigo 10s Severe Upbeating R torsional nystagmus  L Head Roll None   None  R Head Roll None   None  L Sidelying Test      R Sidelying Test       (blank = not tested)  Clinical Test of Sensory Interaction for Balance (CTSIB): Deferred  FUNCTIONAL OUTCOME MEASURES  Results Comments  BERG    DGI    FGA    TUG    5TSTS    6 Minute Walk Test    10 Meter Gait Speed    (blank = not tested)   TODAY'S TREATMENT  Deferred due to time constraints   PATIENT EDUCATION:  Education details: Plan of care and BPPV Person educated: Patient and daughter-in-law Education method: Explanation and handouts Education comprehension: verbalized understanding   HOME EXERCISE PROGRAM:  None currently   ASSESSMENT: CLINICAL IMPRESSION: Patient is a 68 y.o. female who was seen today for physical  therapy evaluation and treatment for dizziness. She arrived late so unfortunately the evaluation was limited and treatment was not able to be initiated. BPPV testing today was positive for R posterior canalithiasis. CRT will be initiated at first follow-up session.   OBJECTIVE IMPAIRMENTS: dizziness.   ACTIVITY LIMITATIONS: bending and reach over head  PARTICIPATION LIMITATIONS: community activity  PERSONAL FACTORS: Age, Past/current experiences, Time since onset of injury/illness/exacerbation, and 1 comorbidity: anxiety are also affecting patient's functional outcome.   REHAB POTENTIAL: Excellent  CLINICAL DECISION MAKING: Unstable/unpredictable  EVALUATION COMPLEXITY: Low   GOALS:  SHORT TERM GOALS: Target date: 09/17/2024  Pt will be independent with HEP for dizziness in order to decrease symptoms, improve balance,decrease fall risk, and improve function at home. Baseline: Goal status: INITIAL   LONG TERM GOALS: Target date: 10/15/2024  Pt will decrease DHI score by at least 18 points in order to demonstrate clinically significant reduction in disability related to dizziness.  Baseline: 32/100 Goal status: INITIAL  2. Pt will report no further episodes of vertigo so that she can perform all bed mobility without symptoms.   Baseline:  Goal status: INITIAL  PLAN: PT FREQUENCY: 1x/week  PT DURATION: 8 weeks  PLANNED INTERVENTIONS: Therapeutic exercises, Therapeutic activity, Neuromuscular re-education, Balance training, Gait training, Patient/Family education, Self Care, Joint mobilization, Joint manipulation, Vestibular training, Canalith repositioning, Orthotic/Fit training, DME instructions, Dry Needling, Electrical stimulation, Spinal manipulation, Spinal mobilization, Cryotherapy, Moist heat, Taping, Traction, Ultrasound, Ionotophoresis 4mg /ml Dexamethasone, Manual therapy, and Re-evaluation.  PLAN FOR NEXT SESSION: Retest BPPV and initiate CRT, once clear perform additional vestibular/oculomotor/balance testing as necessary.   Anjalina Bergevin D Bridgid Printz PT, DPT, GCS  Trejan Buda, PT 08/20/2024, 4:36 PM

## 2024-08-27 ENCOUNTER — Ambulatory Visit

## 2024-08-27 DIAGNOSIS — R42 Dizziness and giddiness: Secondary | ICD-10-CM | POA: Diagnosis not present

## 2024-08-27 NOTE — Therapy (Signed)
 " OUTPATIENT PHYSICAL THERAPY VESTIBULAR TREATMENT  Patient Name: Jamie Trevino MRN: [id number] DOB:[birth year], 68 y.o., female Today's Date: 08/27/2024  END OF SESSION:  PT End of Session - 08/27/24 1056     Visit Number 2    Number of Visits 9    Date for Recertification  10/15/24    Authorization Type eval: 08/20/24    PT Start Time 1055    PT Stop Time 1130    PT Time Calculation (min) 35 min    Activity Tolerance Patient tolerated treatment well    Behavior During Therapy [facility] for tasks assessed/performed         Past Medical History:  Diagnosis Date   Dysrhythmia    History of adenomatous polyp of colon    Hypertension    Multiple thyroid nodules    Past Surgical History:  Procedure Laterality Date   ABDOMINAL HYSTERECTOMY     adenomatous colon polyps  09/14/2016   COLONOSCOPY N/A 12/22/2016   Procedure: COLONOSCOPY;  Surgeon: Lamar ONEIDA Holmes, MD;  Location: [facility] ENDOSCOPY;  Service: Endoscopy;  Laterality: N/A;   COLONOSCOPY WITH PROPOFOL  N/A 01/14/2021   Procedure: COLONOSCOPY WITH PROPOFOL ;  Surgeon: Maryruth Ole ONEIDA, MD;  Location: [facility] ENDOSCOPY;  Service: Endoscopy;  Laterality: N/A;  SPANISH INTERPRETER   TUBAL LIGATION     Patient Active Problem List   Diagnosis Date Noted   Snoring 07/02/2023   Chest pain 10/16/2021   Ascending aorta dilatation 09/17/2020   Hematochezia 09/17/2020   Type 2 diabetes mellitus without complications (HCC) 03/04/2020   Hx of syphilis 02/26/2020   Dizziness 11/07/2019   HL (hearing loss) 11/07/2019   Benign paroxysmal positional vertigo 10/07/2018   Body mass index (BMI) 23.0-23.9, adult 09/17/2018   Dilatation of aorta 09/17/2018   Insomnia, unspecified 03/09/2018   Patellofemoral pain syndrome of both knees 08/24/2017   Multiple thyroid nodules 11/17/2016   Ganglion, left hand 10/24/2016   Gastro-esophageal reflux disease without esophagitis 09/15/2016   H/O adenomatous polyp of colon 09/14/2016   Chest pain  of unknown etiology 01/10/2016   Essential hypertension 12/24/2015   Other abnormal glucose 12/09/2015   TMJ pain dysfunction syndrome 10/08/2013   Neck pain 09/24/2013   Thyroid enlarged 02/26/2013   Encounter for general adult medical examination without abnormal findings 01/24/2012   Benign neoplasm of colon, unspecified 11/14/2010   Menopausal syndrome 12/10/2009   Allergic rhinitis 01/02/2008   Other specified anxiety disorders 11/25/2007   PCP: Tobie Domino, MD  REFERRING PROVIDER: Tobie Domino, MD   REFERRING DIAG: R42 (ICD-10-CM) - Dizziness and giddiness   RATIONALE FOR EVALUATION AND TREATMENT: Rehabilitation  THERAPY DIAG: Dizziness and giddiness  ONSET DATE: Two weeks ago  FOLLOW-UP APPT SCHEDULED WITH REFERRING PROVIDER: No   FROM INITIAL EVALUATION SUBJECTIVE:   Chief Complaint:  Dizziness  Pertinent History Pt reports dizziness/spinning when getting into and out of bed. Symptoms started two weeks ago. Pt is taking Dramamine for her symptoms but did not take yesterday or today. She is doing exercises from the internet which have helped some. Symptoms last for seconds and are always positional. No additional symptoms or health changes reported. 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  History from 04/05/22 Pt arrives reporting recurrent vertigo which started approximately 1 month ago. However symptoms have improved significantly since the initial referral. At this point pt reports the symptoms are infrequent and very brief. She notices the symptoms when she bends forward or rolls over onto  her L side in bed. 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 Last brain MRI was 03/31/21 and showed no acute intracranial abnormality. Age-related cerebral atrophy with mild to moderate chronic small vessel ischemic disease. Prior vestibular history from 08/19/2019: Patient reports dizziness for the past 3-4 years. Patient reports  the episodes come and go, but lately she has been having more frequent and worsening episodes. Patient describes her dizziness as vertigo and unsteadiness and pressure at the top of her head. Patient reports the vertigo lasts seconds to a few minutes. Patient reports she is getting episodes of vertigo more frequently in the last two months and she started to take Meclizine  to help it go away. Patient reports that the Meclizine  helps her to not be so dizzy, but the bothersomeness in her head does not take that away. When asked to describe what bothersomeness means she denies it being headache, but rather a pressure on top of her head.Patient reports she has not had vertigo in the last week because she has been taking the Meclizine , but states she has had dizziness. Patient reports that in the mornings she feels her equilibrium is not so good and that she has to hold onto the walls for balance, which patient reports she has never had to do before. Patient reports bending over, looking up, quick movements, and when she is lying down the left ear causes dizziness. Patient reports the meclizine , alka seltzer and tried homeopathic things like ginger help reduce her symptoms. Patient reports that when she concentrates on a focal point, the vertigo will subside in seconds to a few minutes. Patient reports years ago she fell and had physical therapy and has had therapy for the ears in the past and states she has had BPPV. Patient states she had physical therapy last 22 days ago and she reports that therapy did not help this last time. Patient reports she saw ENT physician and and VNG was normal. Patient reports that she was told that it was not her ear this time. Patient reports she has a referred for neurology scheduled for January 2021.  Description of dizziness: vertigo Frequency: occurs whenever she changes positions or looks up Duration: seconds Symptom nature: positional Progression of symptoms since onset:  better but still present History of similar episodes: Yes  Provocative Factors: laying down or getting up, looking up Easing Factors: dramamine  Auditory complaints (tinnitus, pain, drainage, hearing loss, aural fullness): Yes, L ear pain, tinnitus, denies hearing loss Vision changes (diplopia, visual field loss, recent changes, recent eye exam): No Chest pain/palpitations: Yes, heart racing during episodes History of head injury/concussion: No Stress/anxiety: No Migraines/headaches: No Nausea/vomiting: No Numbness/tingling: No Focal weakness: No Dysarthria/dysphagia/drop attacks: No  Has patient fallen in last 6 months? No Pertinent pain: No Dominant hand: right Imaging: No  Prior level of function: Independent  Red Flags: Negative for bowel/bladder changes, personal history of cancer, chills/fever, night sweats  PRECAUTIONS: None  WEIGHT BEARING RESTRICTIONS No  LIVING ENVIRONMENT: Lives with: lives with their family Lives in: House/apartment Pt reports some instability with walking  PATIENT GOALS Decrease symptoms   OBJECTIVE EXAMINATION  POSTURE: No gross deficits contributing to symptoms  NEUROLOGICAL SCREEN: (2+ unless otherwise noted.) N=normal  Ab=abnormal  Level Dermatome R L Myotome R L Reflex R L  C3 Anterior Neck N N Sidebend C2-3 N N Jaw CN V    C4 Top of Shoulder N N Shoulder Shrug C4 N N Hoffmans UMN  C5 Lateral Upper Arm N N Shoulder ABD C4-5 N N Biceps C5-6    C6 Lateral Arm/ Thumb N N Arm Flex/ Wrist Ext C5-6 N N Brachiorad. C5-6    C7 Middle Finger N N Arm Ext//Wrist Flex C6-7 N N Triceps C7    C8 4th & 5th Finger N N Flex/ Ext Carpi Ulnaris C8 N N Patellar (L3-4)    T1 Medial Arm N N Interossei T1 N N Gastrocnemius    L2 Medial thigh/groin N N Illiopsoas (L2-3) N N     L3 Lower thigh/med.knee N N Quadriceps (L3-4) N N     L4 Medial leg/lat thigh N N Tibialis Ant (L4-5) N N     L5 Lat. leg & dorsal foot N N EHL (L5) N N     S1 post/lat  foot/thigh/leg N N Gastrocnemius (S1-2) N N     S2 Post./med. thigh & leg N N Hamstrings (L4-S3) N N      CRANIAL NERVES II, III, IV, VI: Pupils equal and reactive to light, visual acuity and visual fields are intact, extraocular muscles are intact  V: Facial sensation is intact and symmetric bilaterally  VII: Facial strength is intact and symmetric bilaterally  VIII: Hearing is normal as tested by gross conversation IX, X: Palate elevates midline, normal phonation, uvula midline XI: Shoulder shrug strength is intact  XII: Tongue protrudes midline   COORDINATION Finger to Nose: Dysmetria bilaterally Heel to Shin: Normal Pronator Drift: Positive RUE Rapid Alternating Movements: Normal Finger to Thumb Opposition: Normal   RANGE OF MOTION Cervical Spine AROM WFL and painless in all planes. No functional focal deficits in AROM noted in BUE/BLE  MANUAL MUSCLE TESTING BUE/BLE strength WNL without focal deficits  TRANSFERS/GAIT Independent for transfers and ambulation without assistive device   PATIENT SURVEYS DHI: 32/100  OCULOMOTOR / VESTIBULAR TESTING  Oculomotor Exam- Room Light  Findings Comments  Ocular Alignment normal   Ocular ROM normal   Spontaneous Nystagmus normal   Gaze-Holding Nystagmus abnormal Possible L horizontal beating nystagmus at midrange with L gaze  End-Gaze Nystagmus abnormal See above  Vergence (normal 2-3) not examined   Smooth Pursuit abnormal Saccadic  Cross-Cover Test not examined   Saccades not examined   VOR Cancellation not examined   Left Head Impulse normal   Right Head Impulse abnormal Corrective Saccade  Static Acuity not examined   Dynamic Acuity not examined    Oculomotor Exam- Fixation Suppressed: DeferredBPPV TESTS:  Symptoms Duration Intensity Nystagmus  L Dix-Hallpike None   None  R Dix-Hallpike Vertigo 10s Severe Upbeating R torsional nystagmus  L Head Roll None   None  R Head Roll None   None  L Sidelying Test      R  Sidelying Test      (blank = not tested)  Clinical Test of Sensory Interaction for Balance (CTSIB): Deferred  FUNCTIONAL OUTCOME MEASURES  Results Comments  BERG    DGI    FGA    TUG    5TSTS    6 Minute Walk Test    10 Meter Gait Speed    (blank = not tested)   TODAY'S TREATMENT   SUBJECTIVE: Pt reports that he is doing well today. No changes since the initial evaluation. Denies pain. No specific questions or concerns.    PAIN: Denies   Neuromuscular Re-education  Interval history obtained, BPPV education provided to pt and daughter including handout with HEP, POC defined;   Canalith Repositioning Treatment Positive  R Dix-Hallpike Test for both vertigo and upbeating R torsional nystagmus lasting approximately 10 seconds. Pt treated with 2 bouts of Epley Maneuver for presumed R posterior canal BPPV. Two minute holds in each position and retesting between maneuvers which is negative for both vertigo and nystagmus. After second maneuver R Dix-Hallpike is positive for vertigo with upbeating R torsional nystagmus lasting >1 minute. Performed R Semont Maneuver with 1 minute holds in each position. R Dix-Hallpike is positive afterward for vertigo with upbeating R torsional nystagmus lasting approximately 10s. Completed one final Epley Maneuver with 2 minute holds in each position.    PATIENT EDUCATION:  Education details: Plan of care, BPPV, and HEP Person educated: Patient and daughter Education method: Explanation, video, and handout Education comprehension: verbalized understanding   HOME EXERCISE PROGRAM:  Access Code: [email or web address] URL: [email or web address] Date: 08/27/2024 Prepared by: Selinda Eck  Exercises - Self-Epley Maneuver Right Ear  - 1 x daily - 7 x weekly - 3 reps - 1 minute in each position hold  Patient Education - BPPV   ASSESSMENT:  CLINICAL IMPRESSION: Positive R Dix-Hallpike Test for both vertigo and upbeating R torsional nystagmus  lasting approximately 10 seconds. Pt treated with 2 bouts of Epley Maneuver for presumed R posterior canal BPPV. Two minute holds in each position and retesting between maneuvers which is negative for both vertigo and nystagmus. After second maneuver R Dix-Hallpike is positive for vertigo with upbeating R torsional nystagmus lasting >1 minute. Performed R Semont Maneuver with 1 minute holds in each position. R Dix-Hallpike is positive afterward for vertigo with upbeating R torsional nystagmus lasting approximately 10s. Completed one final Epley Maneuver with 2 minute holds in each position. Education provided to pt and daughter about BPPV and home Epley Maneuver. Provided handout. Plan to retest BPPV and repeat CRT as needed. Once clear perform will perform additional vestibular/oculomotor/balance testing as necessary. Pt will benefit from PT services to address deficits in dizziness in order to improve function at home and decrease risk for falls.  OBJECTIVE IMPAIRMENTS: dizziness.   ACTIVITY LIMITATIONS: bending and reach over head  PARTICIPATION LIMITATIONS: community activity  PERSONAL FACTORS: Age, Past/current experiences, Time since onset of injury/illness/exacerbation, and 1 comorbidity: anxiety are also affecting patient's functional outcome.   REHAB POTENTIAL: Excellent  CLINICAL DECISION MAKING: Unstable/unpredictable  EVALUATION COMPLEXITY: Low   GOALS:  SHORT TERM GOALS: Target date: 09/17/2024  Pt will be independent with HEP for dizziness in order to decrease symptoms, improve balance,decrease fall risk, and improve function at home. Baseline: Goal status: INITIAL   LONG TERM GOALS: Target date: 10/15/2024  Pt will decrease DHI score by at least 18 points in order to demonstrate clinically significant reduction in disability related to dizziness.  Baseline: 32/100 Goal status: INITIAL  2. Pt will report no further episodes of vertigo so that she can perform all bed  mobility without symptoms.  Baseline:  Goal status: INITIAL  PLAN: PT FREQUENCY: 1x/week  PT DURATION: 8 weeks  PLANNED INTERVENTIONS: Therapeutic exercises, Therapeutic activity, Neuromuscular re-education, Balance training, Gait training, Patient/Family education, Self Care, Joint mobilization, Joint manipulation, Vestibular training, Canalith repositioning, Orthotic/Fit training, DME instructions, Dry Needling, Electrical stimulation, Spinal manipulation, Spinal mobilization, Cryotherapy, Moist heat, Taping, Traction, Ultrasound, Ionotophoresis 4mg /ml Dexamethasone, Manual therapy, and Re-evaluation.  PLAN FOR NEXT SESSION: Retest BPPV and initiate CRT, once clear perform additional vestibular/oculomotor/balance testing as necessary.   Miquel Lamson D Teran Knittle PT, DPT, GCS  Bannie Lobban, PT 08/27/2024, 11:44 AM  "

## 2024-09-01 ENCOUNTER — Ambulatory Visit

## 2024-09-03 ENCOUNTER — Ambulatory Visit

## 2024-09-09 NOTE — Therapy (Signed)
 " OUTPATIENT PHYSICAL THERAPY VESTIBULAR TREATMENT  Patient Name: Jamie Trevino MRN: [id number] DOB:04/27/1956, 69 y.o., female Today's Date: 09/10/2024  END OF SESSION:  PT End of Session - 09/10/24 1013     Visit Number 3    Number of Visits 9    Date for Recertification  10/15/24    Authorization Type eval: 08/20/24    PT Start Time 0940    PT Stop Time 1011    PT Time Calculation (min) 31 min    Activity Tolerance Patient tolerated treatment well    Behavior During Therapy [facility] for tasks assessed/performed         Past Medical History:  Diagnosis Date   Dysrhythmia    History of adenomatous polyp of colon    Hypertension    Multiple thyroid nodules    Past Surgical History:  Procedure Laterality Date   ABDOMINAL HYSTERECTOMY     adenomatous colon polyps  09/14/2016   COLONOSCOPY N/A 12/22/2016   Procedure: COLONOSCOPY;  Surgeon: Lamar ONEIDA Holmes, MD;  Location: [facility] ENDOSCOPY;  Service: Endoscopy;  Laterality: N/A;   COLONOSCOPY WITH PROPOFOL  N/A 01/14/2021   Procedure: COLONOSCOPY WITH PROPOFOL ;  Surgeon: Maryruth Ole ONEIDA, MD;  Location: [facility] ENDOSCOPY;  Service: Endoscopy;  Laterality: N/A;  SPANISH INTERPRETER   TUBAL LIGATION     Patient Active Problem List   Diagnosis Date Noted   Snoring 07/02/2023   Chest pain 10/16/2021   Ascending aorta dilatation 09/17/2020   Hematochezia 09/17/2020   Type 2 diabetes mellitus without complications (HCC) 03/04/2020   Hx of syphilis 02/26/2020   Dizziness 11/07/2019   HL (hearing loss) 11/07/2019   Benign paroxysmal positional vertigo 10/07/2018   Body mass index (BMI) 23.0-23.9, adult 09/17/2018   Dilatation of aorta 09/17/2018   Insomnia, unspecified 03/09/2018   Patellofemoral pain syndrome of both knees 08/24/2017   Multiple thyroid nodules 11/17/2016   Ganglion, left hand 10/24/2016   Gastro-esophageal reflux disease without esophagitis 09/15/2016   H/O adenomatous polyp of colon 09/14/2016   Chest pain of  unknown etiology 01/10/2016   Essential hypertension 12/24/2015   Other abnormal glucose 12/09/2015   TMJ pain dysfunction syndrome 10/08/2013   Neck pain 09/24/2013   Thyroid enlarged 02/26/2013   Encounter for general adult medical examination without abnormal findings 01/24/2012   Benign neoplasm of colon, unspecified 11/14/2010   Menopausal syndrome 12/10/2009   Allergic rhinitis 01/02/2008   Other specified anxiety disorders 11/25/2007   PCP: Tobie Domino, MD  REFERRING PROVIDER: Tobie Domino, MD   REFERRING DIAG: R42 (ICD-10-CM) - Dizziness and giddiness   RATIONALE FOR EVALUATION AND TREATMENT: Rehabilitation  THERAPY DIAG: Dizziness and giddiness  ONSET DATE: Two weeks ago  FOLLOW-UP APPT SCHEDULED WITH REFERRING PROVIDER: No   FROM INITIAL EVALUATION SUBJECTIVE:   Chief Complaint:  Dizziness  Pertinent History Pt reports dizziness/spinning when getting into and out of bed. Symptoms started two weeks ago. Pt is taking Dramamine for her symptoms but did not take yesterday or today. She is doing exercises from the internet which have helped some. Symptoms last for seconds and are always positional. No additional symptoms or health changes reported. 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  History from 04/05/22 Pt arrives reporting recurrent vertigo which started approximately 1 month ago. However symptoms have improved significantly since the initial referral. At this point pt reports the symptoms are infrequent and very brief. She notices the symptoms when she bends forward or rolls over onto  her L side in bed. 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 Last brain MRI was 03/31/21 and showed no acute intracranial abnormality. Age-related cerebral atrophy with mild to moderate chronic small vessel ischemic disease. Prior vestibular history from 08/19/2019: Patient reports dizziness for the past 3-4 years. Patient reports the  episodes come and go, but lately she has been having more frequent and worsening episodes. Patient describes her dizziness as vertigo and unsteadiness and pressure at the top of her head. Patient reports the vertigo lasts seconds to a few minutes. Patient reports she is getting episodes of vertigo more frequently in the last two months and she started to take Meclizine  to help it go away. Patient reports that the Meclizine  helps her to not be so dizzy, but the bothersomeness in her head does not take that away. When asked to describe what bothersomeness means she denies it being headache, but rather a pressure on top of her head.Patient reports she has not had vertigo in the last week because she has been taking the Meclizine , but states she has had dizziness. Patient reports that in the mornings she feels her equilibrium is not so good and that she has to hold onto the walls for balance, which patient reports she has never had to do before. Patient reports bending over, looking up, quick movements, and when she is lying down the left ear causes dizziness. Patient reports the meclizine , alka seltzer and tried homeopathic things like ginger help reduce her symptoms. Patient reports that when she concentrates on a focal point, the vertigo will subside in seconds to a few minutes. Patient reports years ago she fell and had physical therapy and has had therapy for the ears in the past and states she has had BPPV. Patient states she had physical therapy last 22 days ago and she reports that therapy did not help this last time. Patient reports she saw ENT physician and and VNG was normal. Patient reports that she was told that it was not her ear this time. Patient reports she has a referred for neurology scheduled for January 2021.  Description of dizziness: vertigo Frequency: occurs whenever she changes positions or looks up Duration: seconds Symptom nature: positional Progression of symptoms since onset: better  but still present History of similar episodes: Yes  Provocative Factors: laying down or getting up, looking up Easing Factors: dramamine  Auditory complaints (tinnitus, pain, drainage, hearing loss, aural fullness): Yes, L ear pain, tinnitus, denies hearing loss Vision changes (diplopia, visual field loss, recent changes, recent eye exam): No Chest pain/palpitations: Yes, heart racing during episodes History of head injury/concussion: No Stress/anxiety: No Migraines/headaches: No Nausea/vomiting: No Numbness/tingling: No Focal weakness: No Dysarthria/dysphagia/drop attacks: No  Has patient fallen in last 6 months? No Pertinent pain: No Dominant hand: right Imaging: No  Prior level of function: Independent  Red Flags: Negative for bowel/bladder changes, personal history of cancer, chills/fever, night sweats  PRECAUTIONS: None  WEIGHT BEARING RESTRICTIONS No  LIVING ENVIRONMENT: Lives with: lives with their family Lives in: House/apartment Pt reports some instability with walking  PATIENT GOALS Decrease symptoms   OBJECTIVE EXAMINATION  POSTURE: No gross deficits contributing to symptoms  NEUROLOGICAL SCREEN: (2+ unless otherwise noted.) N=normal  Ab=abnormal  Level Dermatome R L Myotome R L Reflex R L  C3 Anterior Neck N N Sidebend C2-3 N N Jaw CN V    C4 Top of Shoulder N N Shoulder Shrug C4 N N Hoffmans UMN  C5 Lateral Upper Arm N N Shoulder ABD C4-5 N N Biceps C5-6    C6 Lateral Arm/ Thumb N N Arm Flex/ Wrist Ext C5-6 N N Brachiorad. C5-6    C7 Middle Finger N N Arm Ext//Wrist Flex C6-7 N N Triceps C7    C8 4th & 5th Finger N N Flex/ Ext Carpi Ulnaris C8 N N Patellar (L3-4)    T1 Medial Arm N N Interossei T1 N N Gastrocnemius    L2 Medial thigh/groin N N Illiopsoas (L2-3) N N     L3 Lower thigh/med.knee N N Quadriceps (L3-4) N N     L4 Medial leg/lat thigh N N Tibialis Ant (L4-5) N N     L5 Lat. leg & dorsal foot N N EHL (L5) N N     S1 post/lat  foot/thigh/leg N N Gastrocnemius (S1-2) N N     S2 Post./med. thigh & leg N N Hamstrings (L4-S3) N N      CRANIAL NERVES II, III, IV, VI: Pupils equal and reactive to light, visual acuity and visual fields are intact, extraocular muscles are intact  V: Facial sensation is intact and symmetric bilaterally  VII: Facial strength is intact and symmetric bilaterally  VIII: Hearing is normal as tested by gross conversation IX, X: Palate elevates midline, normal phonation, uvula midline XI: Shoulder shrug strength is intact  XII: Tongue protrudes midline   COORDINATION Finger to Nose: Dysmetria bilaterally Heel to Shin: Normal Pronator Drift: Positive RUE Rapid Alternating Movements: Normal Finger to Thumb Opposition: Normal   RANGE OF MOTION Cervical Spine AROM WFL and painless in all planes. No functional focal deficits in AROM noted in BUE/BLE  MANUAL MUSCLE TESTING BUE/BLE strength WNL without focal deficits  TRANSFERS/GAIT Independent for transfers and ambulation without assistive device   PATIENT SURVEYS DHI: 32/100  OCULOMOTOR / VESTIBULAR TESTING  Oculomotor Exam- Room Light  Findings Comments  Ocular Alignment normal   Ocular ROM normal   Spontaneous Nystagmus normal   Gaze-Holding Nystagmus abnormal Possible L horizontal beating nystagmus at midrange with L gaze  End-Gaze Nystagmus abnormal See above  Vergence (normal 2-3) not examined   Smooth Pursuit abnormal Saccadic  Cross-Cover Test not examined   Saccades not examined   VOR Cancellation not examined   Left Head Impulse normal   Right Head Impulse abnormal Corrective Saccade  Static Acuity not examined   Dynamic Acuity not examined    Oculomotor Exam- Fixation Suppressed: DeferredBPPV TESTS:  Symptoms Duration Intensity Nystagmus  L Dix-Hallpike None   None  R Dix-Hallpike Vertigo 10s Severe Upbeating R torsional nystagmus  L Head Roll None   None  R Head Roll None   None  L Sidelying Test      R  Sidelying Test      (blank = not tested)  Clinical Test of Sensory Interaction for Balance (CTSIB): Deferred  FUNCTIONAL OUTCOME MEASURES  Results Comments  BERG    DGI    FGA    TUG    5TSTS    6 Minute Walk Test    10 Meter Gait Speed    (blank = not tested)   TODAY'S TREATMENT   SUBJECTIVE: Pt reports that he is doing well today. Her vertigo has been improved since the last therapy session and she only had one episode last Friday. No specific questions or concerns.    PAIN: Unrelated   Neuromuscular Re-education  Interval history obtained, BPPV education provided, POC defined;   Canalith Repositioning  Treatment Positive R Dix-Hallpike Test for both vertigo and upbeating R torsional nystagmus lasting approximately 15 seconds. Pt treated with 3 bouts of Epley Maneuver for presumed R posterior canal BPPV. Two minute holds in each position and retesting between maneuvers which is negative for both vertigo and nystagmus. After each maneuver pt reports very brief dizziness when returning to sitting.     PATIENT EDUCATION:  Education details: Plan of care, BPPV, POC Person educated: Patient and daughter-in-law Education method: Explanation Education comprehension: verbalized understanding   HOME EXERCISE PROGRAM:  Access Code: [facility] URL: [email or web address] Date: 08/27/2024 Prepared by: Selinda Eck  Exercises - Self-Epley Maneuver Right Ear  - 1 x daily - 7 x weekly - 3 reps - 1 minute in each position hold  Patient Education - BPPV   ASSESSMENT:  CLINICAL IMPRESSION: Positive R Dix-Hallpike Test for both vertigo and upbeating R torsional nystagmus lasting approximately 15 seconds. Pt treated with 3 bouts of Epley Maneuver for presumed R posterior canal BPPV. Two minute holds in each position and retesting between maneuvers which is negative for both vertigo and nystagmus. After each maneuver pt reports very brief dizziness when returning to  sitting.   Education provided to pt and daughter-in-law about BPPV and plan of care. Plan to retest BPPV and repeat CRT as needed. Once clear will perform additional vestibular/oculomotor/balance testing as necessary. Pt will benefit from PT services to address deficits in dizziness in order to improve function at home and decrease risk for falls.  OBJECTIVE IMPAIRMENTS: dizziness.   ACTIVITY LIMITATIONS: bending and reach over head  PARTICIPATION LIMITATIONS: community activity  PERSONAL FACTORS: Age, Past/current experiences, Time since onset of injury/illness/exacerbation, and 1 comorbidity: anxiety are also affecting patient's functional outcome.   REHAB POTENTIAL: Excellent  CLINICAL DECISION MAKING: Unstable/unpredictable  EVALUATION COMPLEXITY: Low   GOALS:  SHORT TERM GOALS: Target date: 09/17/2024  Pt will be independent with HEP for dizziness in order to decrease symptoms, improve balance,decrease fall risk, and improve function at home. Baseline: Goal status: INITIAL   LONG TERM GOALS: Target date: 10/15/2024  Pt will decrease DHI score by at least 18 points in order to demonstrate clinically significant reduction in disability related to dizziness.  Baseline: 32/100 Goal status: INITIAL  2. Pt will report no further episodes of vertigo so that she can perform all bed mobility without symptoms.  Baseline:  Goal status: INITIAL  PLAN: PT FREQUENCY: 1x/week  PT DURATION: 8 weeks  PLANNED INTERVENTIONS: Therapeutic exercises, Therapeutic activity, Neuromuscular re-education, Balance training, Gait training, Patient/Family education, Self Care, Joint mobilization, Joint manipulation, Vestibular training, Canalith repositioning, Orthotic/Fit training, DME instructions, Dry Needling, Electrical stimulation, Spinal manipulation, Spinal mobilization, Cryotherapy, Moist heat, Taping, Traction, Ultrasound, Ionotophoresis 4mg /ml Dexamethasone, Manual therapy, and  Re-evaluation.  PLAN FOR NEXT SESSION: Retest BPPV and initiate CRT, once clear perform additional vestibular/oculomotor/balance testing as necessary.   Shalon Councilman D Anaka Beazer PT, DPT, GCS  Lily Velasquez, PT 09/10/2024, 10:17 AM  "

## 2024-09-10 ENCOUNTER — Ambulatory Visit: Attending: Family Medicine

## 2024-09-10 DIAGNOSIS — R42 Dizziness and giddiness: Secondary | ICD-10-CM | POA: Insufficient documentation

## 2024-09-17 ENCOUNTER — Ambulatory Visit

## 2024-09-17 DIAGNOSIS — R42 Dizziness and giddiness: Secondary | ICD-10-CM

## 2024-09-17 NOTE — Therapy (Signed)
 " OUTPATIENT PHYSICAL THERAPY VESTIBULAR TREATMENT  Patient Name: Jamie Trevino MRN: [id number] DOB:[birth year], 69 y.o., female Today's Date: 09/17/2024  END OF SESSION:  PT End of Session - 09/17/24 1015     Visit Number 4    Number of Visits 9    Date for Recertification  10/15/24    Authorization Type eval: 08/20/24    PT Start Time 1015    PT Stop Time 1100    PT Time Calculation (min) 45 min    Activity Tolerance Patient tolerated treatment well    Behavior During Therapy [facility] for tasks assessed/performed         Past Medical History:  Diagnosis Date   Dysrhythmia    History of adenomatous polyp of colon    Hypertension    Multiple thyroid nodules    Past Surgical History:  Procedure Laterality Date   ABDOMINAL HYSTERECTOMY     adenomatous colon polyps  09/14/2016   COLONOSCOPY N/A 12/22/2016   Procedure: COLONOSCOPY;  Surgeon: Lamar ONEIDA Holmes, MD;  Location: [facility] ENDOSCOPY;  Service: Endoscopy;  Laterality: N/A;   COLONOSCOPY WITH PROPOFOL  N/A 01/14/2021   Procedure: COLONOSCOPY WITH PROPOFOL ;  Surgeon: Maryruth Ole ONEIDA, MD;  Location: [facility] ENDOSCOPY;  Service: Endoscopy;  Laterality: N/A;  SPANISH INTERPRETER   TUBAL LIGATION     Patient Active Problem List   Diagnosis Date Noted   Snoring 07/02/2023   Chest pain 10/16/2021   Ascending aorta dilatation 09/17/2020   Hematochezia 09/17/2020   Type 2 diabetes mellitus without complications (HCC) 03/04/2020   Hx of syphilis 02/26/2020   Dizziness 11/07/2019   HL (hearing loss) 11/07/2019   Benign paroxysmal positional vertigo 10/07/2018   Body mass index (BMI) 23.0-23.9, adult 09/17/2018   Dilatation of aorta 09/17/2018   Insomnia, unspecified 03/09/2018   Patellofemoral pain syndrome of both knees 08/24/2017   Multiple thyroid nodules 11/17/2016   Ganglion, left hand 10/24/2016   Gastro-esophageal reflux disease without esophagitis 09/15/2016   H/O adenomatous polyp of colon 09/14/2016   Chest pain of  unknown etiology 01/10/2016   Essential hypertension 12/24/2015   Other abnormal glucose 12/09/2015   TMJ pain dysfunction syndrome 10/08/2013   Neck pain 09/24/2013   Thyroid enlarged 02/26/2013   Encounter for general adult medical examination without abnormal findings 01/24/2012   Benign neoplasm of colon, unspecified 11/14/2010   Menopausal syndrome 12/10/2009   Allergic rhinitis 01/02/2008   Other specified anxiety disorders 11/25/2007   PCP: Tobie Domino, MD  REFERRING PROVIDER: Tobie Domino, MD   REFERRING DIAG: R42 (ICD-10-CM) - Dizziness and giddiness   RATIONALE FOR EVALUATION AND TREATMENT: Rehabilitation  THERAPY DIAG: Dizziness and giddiness  ONSET DATE: Two weeks ago  FOLLOW-UP APPT SCHEDULED WITH REFERRING PROVIDER: No   FROM INITIAL EVALUATION SUBJECTIVE:   Chief Complaint:  Dizziness  Pertinent History Pt reports dizziness/spinning when getting into and out of bed. Symptoms started two weeks ago. Pt is taking Dramamine for her symptoms but did not take yesterday or today. She is doing exercises from the internet which have helped some. Symptoms last for seconds and are always positional. No additional symptoms or health changes reported. 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  History from 04/05/22 Pt arrives reporting recurrent vertigo which started approximately 1 month ago. However symptoms have improved significantly since the initial referral. At this point pt reports the symptoms are infrequent and very brief. She notices the symptoms when she bends forward or rolls over onto  her L side in bed. 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 Last brain MRI was 03/31/21 and showed no acute intracranial abnormality. Age-related cerebral atrophy with mild to moderate chronic small vessel ischemic disease. Prior vestibular history from 08/19/2019: Patient reports dizziness for the past 3-4 years. Patient reports the  episodes come and go, but lately she has been having more frequent and worsening episodes. Patient describes her dizziness as vertigo and unsteadiness and pressure at the top of her head. Patient reports the vertigo lasts seconds to a few minutes. Patient reports she is getting episodes of vertigo more frequently in the last two months and she started to take Meclizine  to help it go away. Patient reports that the Meclizine  helps her to not be so dizzy, but the bothersomeness in her head does not take that away. When asked to describe what bothersomeness means she denies it being headache, but rather a pressure on top of her head.Patient reports she has not had vertigo in the last week because she has been taking the Meclizine , but states she has had dizziness. Patient reports that in the mornings she feels her equilibrium is not so good and that she has to hold onto the walls for balance, which patient reports she has never had to do before. Patient reports bending over, looking up, quick movements, and when she is lying down the left ear causes dizziness. Patient reports the meclizine , alka seltzer and tried homeopathic things like ginger help reduce her symptoms. Patient reports that when she concentrates on a focal point, the vertigo will subside in seconds to a few minutes. Patient reports years ago she fell and had physical therapy and has had therapy for the ears in the past and states she has had BPPV. Patient states she had physical therapy last 22 days ago and she reports that therapy did not help this last time. Patient reports she saw ENT physician and and VNG was normal. Patient reports that she was told that it was not her ear this time. Patient reports she has a referred for neurology scheduled for January 2021.  Description of dizziness: vertigo Frequency: occurs whenever she changes positions or looks up Duration: seconds Symptom nature: positional Progression of symptoms since onset: better  but still present History of similar episodes: Yes  Provocative Factors: laying down or getting up, looking up Easing Factors: dramamine  Auditory complaints (tinnitus, pain, drainage, hearing loss, aural fullness): Yes, L ear pain, tinnitus, denies hearing loss Vision changes (diplopia, visual field loss, recent changes, recent eye exam): No Chest pain/palpitations: Yes, heart racing during episodes History of head injury/concussion: No Stress/anxiety: No Migraines/headaches: No Nausea/vomiting: No Numbness/tingling: No Focal weakness: No Dysarthria/dysphagia/drop attacks: No  Has patient fallen in last 6 months? No Pertinent pain: No Dominant hand: right Imaging: No  Prior level of function: Independent  Red Flags: Negative for bowel/bladder changes, personal history of cancer, chills/fever, night sweats  PRECAUTIONS: None  WEIGHT BEARING RESTRICTIONS No  LIVING ENVIRONMENT: Lives with: lives with their family Lives in: House/apartment Pt reports some instability with walking  PATIENT GOALS Decrease symptoms   OBJECTIVE EXAMINATION  POSTURE: No gross deficits contributing to symptoms  NEUROLOGICAL SCREEN: (2+ unless otherwise noted.) N=normal  Ab=abnormal  Level Dermatome R L Myotome R L Reflex R L  C3 Anterior Neck N N Sidebend C2-3 N N Jaw CN V    C4 Top of Shoulder N N Shoulder Shrug C4 N N Hoffmans UMN  C5 Lateral Upper Arm N N Shoulder ABD C4-5 N N Biceps C5-6    C6 Lateral Arm/ Thumb N N Arm Flex/ Wrist Ext C5-6 N N Brachiorad. C5-6    C7 Middle Finger N N Arm Ext//Wrist Flex C6-7 N N Triceps C7    C8 4th & 5th Finger N N Flex/ Ext Carpi Ulnaris C8 N N Patellar (L3-4)    T1 Medial Arm N N Interossei T1 N N Gastrocnemius    L2 Medial thigh/groin N N Illiopsoas (L2-3) N N     L3 Lower thigh/med.knee N N Quadriceps (L3-4) N N     L4 Medial leg/lat thigh N N Tibialis Ant (L4-5) N N     L5 Lat. leg & dorsal foot N N EHL (L5) N N     S1 post/lat  foot/thigh/leg N N Gastrocnemius (S1-2) N N     S2 Post./med. thigh & leg N N Hamstrings (L4-S3) N N      CRANIAL NERVES II, III, IV, VI: Pupils equal and reactive to light, visual acuity and visual fields are intact, extraocular muscles are intact  V: Facial sensation is intact and symmetric bilaterally  VII: Facial strength is intact and symmetric bilaterally  VIII: Hearing is normal as tested by gross conversation IX, X: Palate elevates midline, normal phonation, uvula midline XI: Shoulder shrug strength is intact  XII: Tongue protrudes midline   COORDINATION Finger to Nose: Dysmetria bilaterally Heel to Shin: Normal Pronator Drift: Positive RUE Rapid Alternating Movements: Normal Finger to Thumb Opposition: Normal   RANGE OF MOTION Cervical Spine AROM WFL and painless in all planes. No functional focal deficits in AROM noted in BUE/BLE  MANUAL MUSCLE TESTING BUE/BLE strength WNL without focal deficits  TRANSFERS/GAIT Independent for transfers and ambulation without assistive device   PATIENT SURVEYS DHI: 32/100  OCULOMOTOR / VESTIBULAR TESTING  Oculomotor Exam- Room Light  Findings Comments  Ocular Alignment normal   Ocular ROM normal   Spontaneous Nystagmus normal   Gaze-Holding Nystagmus abnormal Possible L horizontal beating nystagmus at midrange with L gaze  End-Gaze Nystagmus abnormal See above  Vergence (normal 2-3) not examined   Smooth Pursuit abnormal Saccadic  Cross-Cover Test not examined   Saccades not examined   VOR Cancellation not examined   Left Head Impulse normal   Right Head Impulse abnormal Corrective Saccade  Static Acuity not examined   Dynamic Acuity not examined    Oculomotor Exam- Fixation Suppressed: DeferredBPPV TESTS:  Symptoms Duration Intensity Nystagmus  L Dix-Hallpike None   None  R Dix-Hallpike Vertigo 10s Severe Upbeating R torsional nystagmus  L Head Roll None   None  R Head Roll None   None  L Sidelying Test      R  Sidelying Test      (blank = not tested)  Clinical Test of Sensory Interaction for Balance (CTSIB): Deferred  FUNCTIONAL OUTCOME MEASURES  Results Comments  BERG    DGI    FGA    TUG    5TSTS    6 Minute Walk Test    10 Meter Gait Speed    (blank = not tested)   TODAY'S TREATMENT   SUBJECTIVE: Pt reports that she is doing well today. She has not had any additional episodes of vertigo since the last therapy session. No specific questions or concerns.    PAIN: Unrelated   Neuromuscular Re-education  Interval history obtained, BPPV testing explained, POC defined;   Canalith Repositioning Treatment Positive R Dix-Hallpike  Test for very mild vertigo and upbeating R torsional nystagmus lasting approximately 3-4 seconds. Pt treated with 1 bout of R Epley Maneuver for presumed R posterior canal BPPV. One minute holds in each position. R Sidelying Test afterward positive for upbeating R torsional nystagmus with mild vertigo. Completed R Semont Maneuver with one minute holds in each position. R Sidelying Test is positive afterward for downbeating R torsional nystagmus. Performed L Dix-Hallpike Test which is positive for vertigo and downbeating L torsional nystagmus. R Dix-Hallpike positive for upbeating R torsional nystagmus. Deep Head Hang Test which is negative for vertigo and nystagmus. Completed two bouts of Deep Head Hang Maneuver with 1 minute holds in each position. R Dix-Hallpike Testing afterward is positive for very faint upbeating R torsional nystagmus of short duration and very faint nystagmus.  Completed one final R epley Maneuver with one minute holds in each position.   PATIENT EDUCATION:  Education details: Plan of care, BPPV, POC Person educated: Patient and daughter-in-law Education method: Explanation Education comprehension: verbalized understanding   HOME EXERCISE PROGRAM:  Access Code: [facility] URL: [email or web address] Date: 08/27/2024 Prepared  by: Selinda Eck  Exercises - Self-Epley Maneuver Right Ear  - 1 x daily - 7 x weekly - 3 reps - 1 minute in each position hold  Patient Education - BPPV   ASSESSMENT:  CLINICAL IMPRESSION: Positive R Dix-Hallpike Test for very mild vertigo and upbeating R torsional nystagmus lasting approximately 3-4 seconds. Pt treated with 1 bout of R Epley Maneuver for presumed R posterior canal BPPV. One minute holds in each position. R Sidelying Test afterward positive for upbeating R torsional nystagmus with mild vertigo. Completed R Semont Maneuver with one minute holds in each position. R Sidelying Test is positive afterward for downbeating R torsional nystagmus. Performed L Dix-Hallpike Test which is positive for vertigo and downbeating L torsional nystagmus. R Dix-Hallpike positive for upbeating R torsional nystagmus. Deep Head Hang Test which is negative for vertigo and nystagmus. Completed two bouts of Deep Head Hang Maneuver with 1 minute holds in each position. R Dix-Hallpike Testing afterward is positive for very faint upbeating R torsional nystagmus of short duration and very faint nystagmus.  Completed one final R epley Maneuver with one minute holds in each position. Plan to retest BPPV and repeat CRT as needed. Once clear will perform additional vestibular/oculomotor/balance testing as necessary. Pt will benefit from PT services to address deficits in dizziness in order to improve function at home and decrease risk for falls.  OBJECTIVE IMPAIRMENTS: dizziness.   ACTIVITY LIMITATIONS: bending and reach over head  PARTICIPATION LIMITATIONS: community activity  PERSONAL FACTORS: Age, Past/current experiences, Time since onset of injury/illness/exacerbation, and 1 comorbidity: anxiety are also affecting patient's functional outcome.   REHAB POTENTIAL: Excellent  CLINICAL DECISION MAKING: Unstable/unpredictable  EVALUATION COMPLEXITY: Low   GOALS:  SHORT TERM GOALS: Target date:  09/17/2024  Pt will be independent with HEP for dizziness in order to decrease symptoms, improve balance,decrease fall risk, and improve function at home. Baseline: Goal status: INITIAL   LONG TERM GOALS: Target date: 10/15/2024  Pt will decrease DHI score by at least 18 points in order to demonstrate clinically significant reduction in disability related to dizziness.  Baseline: 32/100 Goal status: INITIAL  2. Pt will report no further episodes of vertigo so that she can perform all bed mobility without symptoms.  Baseline:  Goal status: INITIAL  PLAN: PT FREQUENCY: 1x/week  PT DURATION: 8 weeks  PLANNED INTERVENTIONS: Therapeutic exercises, Therapeutic activity, Neuromuscular re-education,  Balance training, Gait training, Patient/Family education, Self Care, Joint mobilization, Joint manipulation, Vestibular training, Canalith repositioning, Orthotic/Fit training, DME instructions, Dry Needling, Electrical stimulation, Spinal manipulation, Spinal mobilization, Cryotherapy, Moist heat, Taping, Traction, Ultrasound, Ionotophoresis 4mg /ml Dexamethasone, Manual therapy, and Re-evaluation.  PLAN FOR NEXT SESSION: Retest BPPV and initiate CRT, once clear perform additional vestibular/oculomotor/balance testing as necessary.   Skip Litke D Lani Mendiola PT, DPT, GCS  Fleta Borgeson, PT 09/17/2024, 2:03 PM  "

## 2024-09-24 ENCOUNTER — Ambulatory Visit

## 2024-09-24 DIAGNOSIS — R42 Dizziness and giddiness: Secondary | ICD-10-CM

## 2024-09-24 NOTE — Therapy (Unsigned)
 " OUTPATIENT PHYSICAL THERAPY VESTIBULAR TREATMENT  Patient Name: Jamie Trevino MRN: [id number] DOB:Jan 18, 1956, 69 y.o., female Today's Date: 09/25/2024  END OF SESSION:  PT End of Session - 09/24/24 1019     Visit Number 5    Number of Visits 9    Date for Recertification  10/15/24    Authorization Type eval: 08/20/24    PT Start Time 1020    PT Stop Time 1100    PT Time Calculation (min) 40 min    Activity Tolerance Patient tolerated treatment well    Behavior During Therapy WFL for tasks assessed/performed         Past Medical History:  Diagnosis Date   Dysrhythmia    History of adenomatous polyp of colon    Hypertension    Multiple thyroid nodules    Past Surgical History:  Procedure Laterality Date   ABDOMINAL HYSTERECTOMY     adenomatous colon polyps  09/14/2016   COLONOSCOPY N/A 12/22/2016   Procedure: COLONOSCOPY;  Surgeon: Lamar ONEIDA Holmes, MD;  Location: [facility] ENDOSCOPY;  Service: Endoscopy;  Laterality: N/A;   COLONOSCOPY WITH PROPOFOL  N/A 01/14/2021   Procedure: COLONOSCOPY WITH PROPOFOL ;  Surgeon: Maryruth Ole ONEIDA, MD;  Location: [facility] ENDOSCOPY;  Service: Endoscopy;  Laterality: N/A;  SPANISH INTERPRETER   TUBAL LIGATION     Patient Active Problem List   Diagnosis Date Noted   Snoring 07/02/2023   Chest pain 10/16/2021   Ascending aorta dilatation 09/17/2020   Hematochezia 09/17/2020   Type 2 diabetes mellitus without complications (HCC) 03/04/2020   Hx of syphilis 02/26/2020   Dizziness 11/07/2019   HL (hearing loss) 11/07/2019   Benign paroxysmal positional vertigo 10/07/2018   Body mass index (BMI) 23.0-23.9, adult 09/17/2018   Dilatation of aorta 09/17/2018   Insomnia, unspecified 03/09/2018   Patellofemoral pain syndrome of both knees 08/24/2017   Multiple thyroid nodules 11/17/2016   Ganglion, left hand 10/24/2016   Gastro-esophageal reflux disease without esophagitis 09/15/2016   H/O adenomatous polyp of colon 09/14/2016   Chest pain of  unknown etiology 01/10/2016   Essential hypertension 12/24/2015   Other abnormal glucose 12/09/2015   TMJ pain dysfunction syndrome 10/08/2013   Neck pain 09/24/2013   Thyroid enlarged 02/26/2013   Encounter for general adult medical examination without abnormal findings 01/24/2012   Benign neoplasm of colon, unspecified 11/14/2010   Menopausal syndrome 12/10/2009   Allergic rhinitis 01/02/2008   Other specified anxiety disorders 11/25/2007   PCP: Tobie Domino, MD  REFERRING PROVIDER: Tobie Domino, MD   REFERRING DIAG: R42 (ICD-10-CM) - Dizziness and giddiness   RATIONALE FOR EVALUATION AND TREATMENT: Rehabilitation  THERAPY DIAG: Dizziness and giddiness  ONSET DATE: Two weeks ago  FOLLOW-UP APPT SCHEDULED WITH REFERRING PROVIDER: No   FROM INITIAL EVALUATION SUBJECTIVE:   Chief Complaint:  Dizziness  Pertinent History Pt reports dizziness/spinning when getting into and out of bed. Symptoms started two weeks ago. Pt is taking Dramamine for her symptoms but did not take yesterday or today. She is doing exercises from the internet which have helped some. Symptoms last for seconds and are always positional. No additional symptoms or health changes reported. 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  History from 04/05/22 Pt arrives reporting recurrent vertigo which started approximately 1 month ago. However symptoms have improved significantly since the initial referral. At this point pt reports the symptoms are infrequent and very brief. She notices the symptoms when she bends forward or rolls over onto  her L side in bed. 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 Last brain MRI was 03/31/21 and showed no acute intracranial abnormality. Age-related cerebral atrophy with mild to moderate chronic small vessel ischemic disease. Prior vestibular history from 08/19/2019: Patient reports dizziness for the past 3-4 years. Patient reports the  episodes come and go, but lately she has been having more frequent and worsening episodes. Patient describes her dizziness as vertigo and unsteadiness and pressure at the top of her head. Patient reports the vertigo lasts seconds to a few minutes. Patient reports she is getting episodes of vertigo more frequently in the last two months and she started to take Meclizine  to help it go away. Patient reports that the Meclizine  helps her to not be so dizzy, but the bothersomeness in her head does not take that away. When asked to describe what bothersomeness means she denies it being headache, but rather a pressure on top of her head.Patient reports she has not had vertigo in the last week because she has been taking the Meclizine , but states she has had dizziness. Patient reports that in the mornings she feels her equilibrium is not so good and that she has to hold onto the walls for balance, which patient reports she has never had to do before. Patient reports bending over, looking up, quick movements, and when she is lying down the left ear causes dizziness. Patient reports the meclizine , alka seltzer and tried homeopathic things like ginger help reduce her symptoms. Patient reports that when she concentrates on a focal point, the vertigo will subside in seconds to a few minutes. Patient reports years ago she fell and had physical therapy and has had therapy for the ears in the past and states she has had BPPV. Patient states she had physical therapy last 22 days ago and she reports that therapy did not help this last time. Patient reports she saw ENT physician and and VNG was normal. Patient reports that she was told that it was not her ear this time. Patient reports she has a referred for neurology scheduled for January 2021.  Description of dizziness: vertigo Frequency: occurs whenever she changes positions or looks up Duration: seconds Symptom nature: positional Progression of symptoms since onset: better  but still present History of similar episodes: Yes  Provocative Factors: laying down or getting up, looking up Easing Factors: dramamine  Auditory complaints (tinnitus, pain, drainage, hearing loss, aural fullness): Yes, L ear pain, tinnitus, denies hearing loss Vision changes (diplopia, visual field loss, recent changes, recent eye exam): No Chest pain/palpitations: Yes, heart racing during episodes History of head injury/concussion: No Stress/anxiety: No Migraines/headaches: No Nausea/vomiting: No Numbness/tingling: No Focal weakness: No Dysarthria/dysphagia/drop attacks: No  Has patient fallen in last 6 months? No Pertinent pain: No Dominant hand: right Imaging: No  Prior level of function: Independent  Red Flags: Negative for bowel/bladder changes, personal history of cancer, chills/fever, night sweats  PRECAUTIONS: None  WEIGHT BEARING RESTRICTIONS No  LIVING ENVIRONMENT: Lives with: lives with their family Lives in: House/apartment Pt reports some instability with walking  PATIENT GOALS Decrease symptoms   OBJECTIVE EXAMINATION  POSTURE: No gross deficits contributing to symptoms  NEUROLOGICAL SCREEN: (2+ unless otherwise noted.) N=normal  Ab=abnormal  Level Dermatome R L Myotome R L Reflex R L  C3 Anterior Neck N N Sidebend C2-3 N N Jaw CN V    C4 Top of Shoulder N N Shoulder Shrug C4 N N Hoffmans UMN  C5 Lateral Upper Arm N N Shoulder ABD C4-5 N N Biceps C5-6    C6 Lateral Arm/ Thumb N N Arm Flex/ Wrist Ext C5-6 N N Brachiorad. C5-6    C7 Middle Finger N N Arm Ext//Wrist Flex C6-7 N N Triceps C7    C8 4th & 5th Finger N N Flex/ Ext Carpi Ulnaris C8 N N Patellar (L3-4)    T1 Medial Arm N N Interossei T1 N N Gastrocnemius    L2 Medial thigh/groin N N Illiopsoas (L2-3) N N     L3 Lower thigh/med.knee N N Quadriceps (L3-4) N N     L4 Medial leg/lat thigh N N Tibialis Ant (L4-5) N N     L5 Lat. leg & dorsal foot N N EHL (L5) N N     S1 post/lat  foot/thigh/leg N N Gastrocnemius (S1-2) N N     S2 Post./med. thigh & leg N N Hamstrings (L4-S3) N N      CRANIAL NERVES II, III, IV, VI: Pupils equal and reactive to light, visual acuity and visual fields are intact, extraocular muscles are intact  V: Facial sensation is intact and symmetric bilaterally  VII: Facial strength is intact and symmetric bilaterally  VIII: Hearing is normal as tested by gross conversation IX, X: Palate elevates midline, normal phonation, uvula midline XI: Shoulder shrug strength is intact  XII: Tongue protrudes midline   COORDINATION Finger to Nose: Dysmetria bilaterally Heel to Shin: Normal Pronator Drift: Positive RUE Rapid Alternating Movements: Normal Finger to Thumb Opposition: Normal   RANGE OF MOTION Cervical Spine AROM WFL and painless in all planes. No functional focal deficits in AROM noted in BUE/BLE  MANUAL MUSCLE TESTING BUE/BLE strength WNL without focal deficits  TRANSFERS/GAIT Independent for transfers and ambulation without assistive device   PATIENT SURVEYS DHI: 32/100  OCULOMOTOR / VESTIBULAR TESTING  Oculomotor Exam- Room Light  Findings Comments  Ocular Alignment normal   Ocular ROM normal   Spontaneous Nystagmus normal   Gaze-Holding Nystagmus abnormal Possible L horizontal beating nystagmus at midrange with L gaze  End-Gaze Nystagmus abnormal See above  Vergence (normal 2-3) not examined   Smooth Pursuit abnormal Saccadic  Cross-Cover Test not examined   Saccades not examined   VOR Cancellation not examined   Left Head Impulse normal   Right Head Impulse abnormal Corrective Saccade  Static Acuity not examined   Dynamic Acuity not examined    Oculomotor Exam- Fixation Suppressed: DeferredBPPV TESTS:  Symptoms Duration Intensity Nystagmus  L Dix-Hallpike None   None  R Dix-Hallpike Vertigo 10s Severe Upbeating R torsional nystagmus  L Head Roll None   None  R Head Roll None   None  L Sidelying Test      R  Sidelying Test      (blank = not tested)  Clinical Test of Sensory Interaction for Balance (CTSIB): Deferred  FUNCTIONAL OUTCOME MEASURES  Results Comments  BERG    DGI    FGA    TUG    5TSTS    6 Minute Walk Test    10 Meter Gait Speed    (blank = not tested)   TODAY'S TREATMENT   SUBJECTIVE: Pt reports that she is doing well today. She has not had any additional episodes of vertigo since the last therapy session. No specific questions or concerns.    PAIN: Unrelated   Neuromuscular Re-education  Interval history obtained, BPPV testing explained, POC defined;   Canalith Repositioning Treatment Negative Dix-Hallpike Test  bilaterally for very mild vertigo or nystagmus. Negative L Sidelying Test for vertigo or Nystagmus. R Sidelying Test positive for 3-5 beats of upbeating R torsional nystagmus lasting 2-3s with very faint pressure in my head.  Pt treated with 3 bouts of the R Semont Maneuver with one minute holds in each position and retesting between maneuvers. R Sidelying Test after first maneuver is positive for 3-5 pure downbeats of nystagmus but no vertigo. After second maneuver R Sidelying Test is negative for both vertigo and nystagmus. Completed final Semont Maneuver with R Dix-Hallpike Test afterward which is negative for nystagmus but pt reports a very fleeting episode of dizziness for 1-2s after the first 20s. Performed one R Epley Maneuver with 1 minute holds in each position.   PATIENT EDUCATION:  Education details: Plan of care, BPPV, POC Person educated: Patient and daughter-in-law Education method: Explanation Education comprehension: verbalized understanding   HOME EXERCISE PROGRAM:  Access Code: [facility] URL: [email or web address] Date: 08/27/2024 Prepared by: Selinda Eck  Exercises - Self-Epley Maneuver Right Ear  - 1 x daily - 7 x weekly - 3 reps - 1 minute in each position hold  Patient Education -  BPPV   ASSESSMENT:  CLINICAL IMPRESSION: Negative Dix-Hallpike Test bilaterally for very mild vertigo or nystagmus. Negative L Sidelying Test for vertigo or Nystagmus. R Sidelying Test positive for 3-5 beats of upbeating R torsional nystagmus lasting 2-3s with very faint pressure in my head.  Pt treated with 3 bouts of the R Semont Maneuver with one minute holds in each position and retesting between maneuvers. R Sidelying Test after first maneuver is positive for 3-5 pure downbeats of nystagmus but no vertigo. After second maneuver R Sidelying Test is negative for both vertigo and nystagmus. Completed final Semont Maneuver with R Dix-Hallpike Test afterward which is negative for nystagmus but pt reports a very fleeting episode of dizziness for 1-2s after the first 20s. Performed one R Epley Maneuver with 1 minute holds in each position. Plan to retest BPPV and repeat CRT as needed. Once clear will perform additional vestibular/oculomotor/balance testing as necessary. Pt will benefit from PT services to address deficits in dizziness in order to improve function at home and decrease risk for falls.  OBJECTIVE IMPAIRMENTS: dizziness.   ACTIVITY LIMITATIONS: bending and reach over head  PARTICIPATION LIMITATIONS: community activity  PERSONAL FACTORS: Age, Past/current experiences, Time since onset of injury/illness/exacerbation, and 1 comorbidity: anxiety are also affecting patient's functional outcome.   REHAB POTENTIAL: Excellent  CLINICAL DECISION MAKING: Unstable/unpredictable  EVALUATION COMPLEXITY: Low   GOALS:  SHORT TERM GOALS: Target date: 09/17/2024  Pt will be independent with HEP for dizziness in order to decrease symptoms, improve balance,decrease fall risk, and improve function at home. Baseline: Goal status: INITIAL   LONG TERM GOALS: Target date: 10/15/2024  Pt will decrease DHI score by at least 18 points in order to demonstrate clinically significant reduction in  disability related to dizziness.  Baseline: 32/100 Goal status: INITIAL  2. Pt will report no further episodes of vertigo so that she can perform all bed mobility without symptoms.  Baseline:  Goal status: INITIAL  PLAN: PT FREQUENCY: 1x/week  PT DURATION: 8 weeks  PLANNED INTERVENTIONS: Therapeutic exercises, Therapeutic activity, Neuromuscular re-education, Balance training, Gait training, Patient/Family education, Self Care, Joint mobilization, Joint manipulation, Vestibular training, Canalith repositioning, Orthotic/Fit training, DME instructions, Dry Needling, Electrical stimulation, Spinal manipulation, Spinal mobilization, Cryotherapy, Moist heat, Taping, Traction, Ultrasound, Ionotophoresis 4mg /ml Dexamethasone, Manual therapy, and Re-evaluation.  PLAN FOR NEXT SESSION:  Retest BPPV and initiate CRT, once clear perform additional vestibular/oculomotor/balance testing as necessary.   Aiyana Stegmann D Rochanda Harpham PT, DPT, GCS  Nivea Wojdyla, PT 09/25/2024, 10:12 AM  "

## 2024-09-30 NOTE — Therapy (Signed)
 " OUTPATIENT PHYSICAL THERAPY VESTIBULAR TREATMENT  Patient Name: Jamie Trevino MRN: [id number] DOB:09-19-1955, 69 y.o., female Today's Date: 10/01/2024  END OF SESSION:  PT End of Session - 10/01/24 1019     Visit Number 6    Number of Visits 9    Date for Recertification  10/15/24    Authorization Type eval: 08/20/24    PT Start Time 1022    PT Stop Time 1100    PT Time Calculation (min) 38 min    Activity Tolerance Patient tolerated treatment well    Behavior During Therapy WFL for tasks assessed/performed         Past Medical History:  Diagnosis Date   Dysrhythmia    History of adenomatous polyp of colon    Hypertension    Multiple thyroid nodules    Past Surgical History:  Procedure Laterality Date   ABDOMINAL HYSTERECTOMY     adenomatous colon polyps  09/14/2016   COLONOSCOPY N/A 12/22/2016   Procedure: COLONOSCOPY;  Surgeon: Lamar ONEIDA Holmes, MD;  Location: [facility] ENDOSCOPY;  Service: Endoscopy;  Laterality: N/A;   COLONOSCOPY WITH PROPOFOL  N/A 01/14/2021   Procedure: COLONOSCOPY WITH PROPOFOL ;  Surgeon: Maryruth Ole ONEIDA, MD;  Location: [facility] ENDOSCOPY;  Service: Endoscopy;  Laterality: N/A;  SPANISH INTERPRETER   TUBAL LIGATION     Patient Active Problem List   Diagnosis Date Noted   Snoring 07/02/2023   Chest pain 10/16/2021   Ascending aorta dilatation 09/17/2020   Hematochezia 09/17/2020   Type 2 diabetes mellitus without complications (HCC) 03/04/2020   Hx of syphilis 02/26/2020   Dizziness 11/07/2019   HL (hearing loss) 11/07/2019   Benign paroxysmal positional vertigo 10/07/2018   Body mass index (BMI) 23.0-23.9, adult 09/17/2018   Dilatation of aorta 09/17/2018   Insomnia, unspecified 03/09/2018   Patellofemoral pain syndrome of both knees 08/24/2017   Multiple thyroid nodules 11/17/2016   Ganglion, left hand 10/24/2016   Gastro-esophageal reflux disease without esophagitis 09/15/2016   H/O adenomatous polyp of colon 09/14/2016   Chest pain of  unknown etiology 01/10/2016   Essential hypertension 12/24/2015   Other abnormal glucose 12/09/2015   TMJ pain dysfunction syndrome 10/08/2013   Neck pain 09/24/2013   Thyroid enlarged 02/26/2013   Encounter for general adult medical examination without abnormal findings 01/24/2012   Benign neoplasm of colon, unspecified 11/14/2010   Menopausal syndrome 12/10/2009   Allergic rhinitis 01/02/2008   Other specified anxiety disorders 11/25/2007   PCP: Tobie Domino, MD  REFERRING PROVIDER: Tobie Domino, MD   REFERRING DIAG: R42 (ICD-10-CM) - Dizziness and giddiness   RATIONALE FOR EVALUATION AND TREATMENT: Rehabilitation  THERAPY DIAG: Dizziness and giddiness  ONSET DATE: Two weeks ago  FOLLOW-UP APPT SCHEDULED WITH REFERRING PROVIDER: No   FROM INITIAL EVALUATION SUBJECTIVE:   Chief Complaint:  Dizziness  Pertinent History Pt reports dizziness/spinning when getting into and out of bed. Symptoms started two weeks ago. Pt is taking Dramamine for her symptoms but did not take yesterday or today. She is doing exercises from the internet which have helped some. Symptoms last for seconds and are always positional. No additional symptoms or health changes reported. 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  History from 04/05/22 Pt arrives reporting recurrent vertigo which started approximately 1 month ago. However symptoms have improved significantly since the initial referral. At this point pt reports the symptoms are infrequent and very brief. She notices the symptoms when she bends forward or rolls over onto  her L side in bed. 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 Last brain MRI was 03/31/21 and showed no acute intracranial abnormality. Age-related cerebral atrophy with mild to moderate chronic small vessel ischemic disease. Prior vestibular history from 08/19/2019: Patient reports dizziness for the past 3-4 years. Patient reports the  episodes come and go, but lately she has been having more frequent and worsening episodes. Patient describes her dizziness as vertigo and unsteadiness and pressure at the top of her head. Patient reports the vertigo lasts seconds to a few minutes. Patient reports she is getting episodes of vertigo more frequently in the last two months and she started to take Meclizine  to help it go away. Patient reports that the Meclizine  helps her to not be so dizzy, but the bothersomeness in her head does not take that away. When asked to describe what bothersomeness means she denies it being headache, but rather a pressure on top of her head.Patient reports she has not had vertigo in the last week because she has been taking the Meclizine , but states she has had dizziness. Patient reports that in the mornings she feels her equilibrium is not so good and that she has to hold onto the walls for balance, which patient reports she has never had to do before. Patient reports bending over, looking up, quick movements, and when she is lying down the left ear causes dizziness. Patient reports the meclizine , alka seltzer and tried homeopathic things like ginger help reduce her symptoms. Patient reports that when she concentrates on a focal point, the vertigo will subside in seconds to a few minutes. Patient reports years ago she fell and had physical therapy and has had therapy for the ears in the past and states she has had BPPV. Patient states she had physical therapy last 22 days ago and she reports that therapy did not help this last time. Patient reports she saw ENT physician and and VNG was normal. Patient reports that she was told that it was not her ear this time. Patient reports she has a referred for neurology scheduled for January 2021.  Description of dizziness: vertigo Frequency: occurs whenever she changes positions or looks up Duration: seconds Symptom nature: positional Progression of symptoms since onset: better  but still present History of similar episodes: Yes  Provocative Factors: laying down or getting up, looking up Easing Factors: dramamine  Auditory complaints (tinnitus, pain, drainage, hearing loss, aural fullness): Yes, L ear pain, tinnitus, denies hearing loss Vision changes (diplopia, visual field loss, recent changes, recent eye exam): No Chest pain/palpitations: Yes, heart racing during episodes History of head injury/concussion: No Stress/anxiety: No Migraines/headaches: No Nausea/vomiting: No Numbness/tingling: No Focal weakness: No Dysarthria/dysphagia/drop attacks: No  Has patient fallen in last 6 months? No Pertinent pain: No Dominant hand: right Imaging: No  Prior level of function: Independent  Red Flags: Negative for bowel/bladder changes, personal history of cancer, chills/fever, night sweats  PRECAUTIONS: None  WEIGHT BEARING RESTRICTIONS No  LIVING ENVIRONMENT: Lives with: lives with their family Lives in: House/apartment Pt reports some instability with walking  PATIENT GOALS Decrease symptoms   OBJECTIVE EXAMINATION  POSTURE: No gross deficits contributing to symptoms  NEUROLOGICAL SCREEN: (2+ unless otherwise noted.) N=normal  Ab=abnormal  Level Dermatome R L Myotome R L Reflex R L  C3 Anterior Neck N N Sidebend C2-3 N N Jaw CN V    C4 Top of Shoulder N N Shoulder Shrug C4 N N Hoffmans UMN  C5 Lateral Upper Arm N N Shoulder ABD C4-5 N N Biceps C5-6    C6 Lateral Arm/ Thumb N N Arm Flex/ Wrist Ext C5-6 N N Brachiorad. C5-6    C7 Middle Finger N N Arm Ext//Wrist Flex C6-7 N N Triceps C7    C8 4th & 5th Finger N N Flex/ Ext Carpi Ulnaris C8 N N Patellar (L3-4)    T1 Medial Arm N N Interossei T1 N N Gastrocnemius    L2 Medial thigh/groin N N Illiopsoas (L2-3) N N     L3 Lower thigh/med.knee N N Quadriceps (L3-4) N N     L4 Medial leg/lat thigh N N Tibialis Ant (L4-5) N N     L5 Lat. leg & dorsal foot N N EHL (L5) N N     S1 post/lat  foot/thigh/leg N N Gastrocnemius (S1-2) N N     S2 Post./med. thigh & leg N N Hamstrings (L4-S3) N N      CRANIAL NERVES II, III, IV, VI: Pupils equal and reactive to light, visual acuity and visual fields are intact, extraocular muscles are intact  V: Facial sensation is intact and symmetric bilaterally  VII: Facial strength is intact and symmetric bilaterally  VIII: Hearing is normal as tested by gross conversation IX, X: Palate elevates midline, normal phonation, uvula midline XI: Shoulder shrug strength is intact  XII: Tongue protrudes midline   COORDINATION Finger to Nose: Dysmetria bilaterally Heel to Shin: Normal Pronator Drift: Positive RUE Rapid Alternating Movements: Normal Finger to Thumb Opposition: Normal   RANGE OF MOTION Cervical Spine AROM WFL and painless in all planes. No functional focal deficits in AROM noted in BUE/BLE  MANUAL MUSCLE TESTING BUE/BLE strength WNL without focal deficits  TRANSFERS/GAIT Independent for transfers and ambulation without assistive device   PATIENT SURVEYS DHI: 32/100  OCULOMOTOR / VESTIBULAR TESTING  Oculomotor Exam- Room Light  Findings Comments  Ocular Alignment normal   Ocular ROM normal   Spontaneous Nystagmus normal   Gaze-Holding Nystagmus abnormal Possible L horizontal beating nystagmus at midrange with L gaze  End-Gaze Nystagmus abnormal See above  Vergence (normal 2-3) not examined   Smooth Pursuit abnormal Saccadic  Cross-Cover Test not examined   Saccades not examined   VOR Cancellation not examined   Left Head Impulse normal   Right Head Impulse abnormal Corrective Saccade  Static Acuity not examined   Dynamic Acuity not examined    Oculomotor Exam- Fixation Suppressed: DeferredBPPV TESTS:  Symptoms Duration Intensity Nystagmus  L Dix-Hallpike None   None  R Dix-Hallpike Vertigo 10s Severe Upbeating R torsional nystagmus  L Head Roll None   None  R Head Roll None   None  L Sidelying Test      R  Sidelying Test      (blank = not tested)  Clinical Test of Sensory Interaction for Balance (CTSIB): Deferred  FUNCTIONAL OUTCOME MEASURES  Results Comments  BERG    DGI    FGA    TUG    5TSTS    6 Minute Walk Test    10 Meter Gait Speed    (blank = not tested)   TODAY'S TREATMENT   SUBJECTIVE: Pt reports that she is doing well today. She has not had any additional episodes of vertigo since the last therapy session. No specific questions or concerns.    PAIN: Unrelated   Neuromuscular Re-education  Interval history obtained, BPPV testing explained, POC defined;   Canalith Repositioning Treatment Negative R Dix-Hallpike  Test for vertigo or nystagmus. Completed one bouts of the R Epley maneuver with 1 minute holds in each position. R Sidelying Test positive for 8-10 beats of upbeating R torsional nystagmus with concurrent vertigo. Pt treated with 1 bout of the R Semont Maneuver with one minute holds in each position and vibration on R mastoid in second position. Seated half-somersault maneuver performed for R side with 1 minute holds in each position. R Sidelying Test is positive for vertigo and nystagmus. Completed two additional Semont Maneuvers with 1 minute holds in each position and retesting between maneuvers which remains positive for vertigo and nystagmus.   PATIENT EDUCATION:  Education details: Plan of care, BPPV, POC Person educated: Patient and daughter-in-law Education method: Explanation Education comprehension: verbalized understanding   HOME EXERCISE PROGRAM:  Access Code: [facility] URL: [email or web address] Date: 08/27/2024 Prepared by: Selinda Eck  Exercises - Self-Epley Maneuver Right Ear  - 1 x daily - 7 x weekly - 3 reps - 1 minute in each position hold  Patient Education - BPPV   ASSESSMENT:  CLINICAL IMPRESSION: Negative R Dix-Hallpike Test for vertigo or nystagmus. Completed one bouts of the R Epley maneuver with 1 minute  holds in each position. R Sidelying Test positive for 8-10 beats of upbeating R torsional nystagmus with concurrent vertigo. Pt treated with 1 bout of the R Semont Maneuver with one minute holds in each position and vibration on R mastoid in second position. Seated half-somersault maneuver performed for R side with 1 minute holds in each position. R Sidelying Test is positive for vertigo and nystagmus. Completed two additional Semont Maneuvers with 1 minute holds in each position and retesting between maneuvers which remains positive for vertigo and nystagmus. Plan to retest BPPV and repeat CRT as needed. Once clear will perform additional vestibular/oculomotor/balance testing as necessary. Pt will benefit from PT services to address deficits in dizziness in order to improve function at home and decrease risk for falls.  OBJECTIVE IMPAIRMENTS: dizziness.   ACTIVITY LIMITATIONS: bending and reach over head  PARTICIPATION LIMITATIONS: community activity  PERSONAL FACTORS: Age, Past/current experiences, Time since onset of injury/illness/exacerbation, and 1 comorbidity: anxiety are also affecting patient's functional outcome.   REHAB POTENTIAL: Excellent  CLINICAL DECISION MAKING: Unstable/unpredictable  EVALUATION COMPLEXITY: Low   GOALS:  SHORT TERM GOALS: Target date: 09/17/2024  Pt will be independent with HEP for dizziness in order to decrease symptoms, improve balance,decrease fall risk, and improve function at home. Baseline: Goal status: INITIAL   LONG TERM GOALS: Target date: 10/15/2024  Pt will decrease DHI score by at least 18 points in order to demonstrate clinically significant reduction in disability related to dizziness.  Baseline: 32/100 Goal status: INITIAL  2. Pt will report no further episodes of vertigo so that she can perform all bed mobility without symptoms.  Baseline:  Goal status: INITIAL  PLAN: PT FREQUENCY: 1x/week  PT DURATION: 8 weeks  PLANNED  INTERVENTIONS: Therapeutic exercises, Therapeutic activity, Neuromuscular re-education, Balance training, Gait training, Patient/Family education, Self Care, Joint mobilization, Joint manipulation, Vestibular training, Canalith repositioning, Orthotic/Fit training, DME instructions, Dry Needling, Electrical stimulation, Spinal manipulation, Spinal mobilization, Cryotherapy, Moist heat, Taping, Traction, Ultrasound, Ionotophoresis 4mg /ml Dexamethasone, Manual therapy, and Re-evaluation.  PLAN FOR NEXT SESSION: Retest BPPV and initiate CRT, once clear perform additional vestibular/oculomotor/balance testing as necessary.   Dona Klemann D Yaqueline Gutter PT, DPT, GCS  Mirjana Tarleton, PT 10/01/2024, 11:09 AM  "

## 2024-10-01 ENCOUNTER — Ambulatory Visit

## 2024-10-01 DIAGNOSIS — R42 Dizziness and giddiness: Secondary | ICD-10-CM

## 2024-10-08 ENCOUNTER — Ambulatory Visit

## 2024-10-08 ENCOUNTER — Other Ambulatory Visit: Payer: Self-pay | Admitting: Family Medicine

## 2024-10-08 DIAGNOSIS — R42 Dizziness and giddiness: Secondary | ICD-10-CM

## 2024-10-08 DIAGNOSIS — R519 Headache, unspecified: Secondary | ICD-10-CM

## 2024-10-10 ENCOUNTER — Ambulatory Visit: Admission: RE | Admit: 2024-10-10

## 2024-10-10 DIAGNOSIS — R519 Headache, unspecified: Secondary | ICD-10-CM

## 2024-10-16 ENCOUNTER — Ambulatory Visit
